# Patient Record
Sex: Female | Born: 1965 | Race: White | Hispanic: No | Marital: Single | Smoking: Never smoker
Health system: Southern US, Community
[De-identification: ages and names within clinical notes are randomized; demographics above are authoritative.]

## PROBLEM LIST (undated history)

## (undated) DIAGNOSIS — Z9889 Other specified postprocedural states: Secondary | ICD-10-CM

## (undated) DIAGNOSIS — F419 Anxiety disorder, unspecified: Secondary | ICD-10-CM

## (undated) DIAGNOSIS — I10 Essential (primary) hypertension: Secondary | ICD-10-CM

## (undated) DIAGNOSIS — R112 Nausea with vomiting, unspecified: Secondary | ICD-10-CM

## (undated) HISTORY — PX: BREAST EXCISIONAL BIOPSY: SUR124

---

## 1998-07-20 HISTORY — PX: OTHER SURGICAL HISTORY: SHX169

## 2001-12-15 ENCOUNTER — Ambulatory Visit (HOSPITAL_BASED_OUTPATIENT_CLINIC_OR_DEPARTMENT_OTHER): Admission: RE | Admit: 2001-12-15 | Discharge: 2001-12-15 | Payer: Self-pay | Admitting: Orthopedic Surgery

## 2009-12-02 ENCOUNTER — Encounter: Admission: RE | Admit: 2009-12-02 | Discharge: 2009-12-02 | Payer: Self-pay | Admitting: Family Medicine

## 2010-08-11 ENCOUNTER — Encounter: Payer: Self-pay | Admitting: Internal Medicine

## 2010-12-05 NOTE — Op Note (Signed)
[place]. [facility]  Patient:    Anita Moreno, Anita Moreno Visit Number: [id number] MRN: [id number]          Service Type: DSU Location: [facility] Attending Physician:  Colbert Ewing Dictated by:   Loreta Ave, M.D. Proc. 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  PROCEDURES: 1. Right knee examination under anesthesia. 2. Arthroscopy. 3. Chondroplasty of medial femoral condyle and patellofemoral joint.  SURGEON:  Loreta Ave, M.D.  ASSISTANT:  Arlys John D. Petrarca, P.A.-C.  ANESTHESIA:  General.  ESTIMATED BLOOD LOSS:  Minimal.  SPECIMENS:  None.  CULTURES:  None.  COMPLICATIONS:  None.  DRAINS:  Soft compressive.  DESCRIPTION OF PROCEDURE:  Patient brought to the operating room and placed on the operating table in supine position.  After adequate anesthesia had been obtained, right knee examined.  Some lateral tracking and increased Q-angle but did not tether.  Full motion and stable knee.  Tourniquet and leg holder applied, leg prepped and draped in the usual sterile fashion.  Three portals created, one superolateral, one each medial and lateral peripatellar.  Inflow catheter introduced, knee distended, arthroscope introduced, and the knee inspected.  A little lateral tracking but no lateral tethering, patellofemoral joint.  Focal grade 2 and 3 changes in the peak of the patella and on the trochlea, treated with chondroplasty to a stable surface.  After this, the patellofemoral joint looked reasonably good with remaining cartilage of reasonable thickness throughout.  The medial compartment revealed extensive grade 3 changes with chondral flaps, loose bodies, the entire weightbearing dome, medial femoral condyle.  Loose  bodies removed, chondroplasty to a stable surface.  Plateau looked good.  No medial meniscus tear.  Cruciate ligaments intact.  Lateral meniscus, lateral compartment normal.  The entire knee examined and no significant findings appreciated.  Instruments and fluid removed.  Portals and knee injected with Marcaine.  Portals closed with 4-0 nylon.  Sterile compressive dressing applied.  Anesthesia reversed.  Brought to the recovery room.  Tolerated the surgery well without complications. Dictated by:   Loreta Ave, M.D. Attending Physician:  Colbert Ewing DD:  12/15/01 TD:  12/16/01 Job: [id number]

## 2011-01-09 ENCOUNTER — Other Ambulatory Visit: Payer: Self-pay | Admitting: Family Medicine

## 2011-01-09 DIAGNOSIS — Z1231 Encounter for screening mammogram for malignant neoplasm of breast: Secondary | ICD-10-CM

## 2011-01-25 ENCOUNTER — Emergency Department (HOSPITAL_COMMUNITY)
Admission: EM | Admit: 2011-01-25 | Discharge: 2011-01-26 | Disposition: A | Discharge status: Home / Self Care | Payer: 59 | Attending: Emergency Medicine | Admitting: Emergency Medicine

## 2011-01-25 ENCOUNTER — Emergency Department (HOSPITAL_COMMUNITY): Payer: 59

## 2011-01-25 DIAGNOSIS — R079 Chest pain, unspecified: Secondary | ICD-10-CM | POA: Insufficient documentation

## 2011-01-25 HISTORY — DX: Essential (primary) hypertension: I10

## 2011-01-25 LAB — CBC
Hemoglobin: 12.9 g/dL (ref 12.0–15.0)
MCH: 28.4 pg (ref 26.0–34.0)
MCHC: 34.6 g/dL (ref 30.0–36.0)
RBC: 4.55 MIL/uL (ref 3.87–5.11)
RDW: 15.3 % (ref 11.5–15.5)
WBC: 7.3 10*3/uL (ref 4.0–10.5)

## 2011-01-25 LAB — BASIC METABOLIC PANEL
CO2: 26 mEq/L (ref 19–32)
Creatinine, Ser: 0.66 mg/dL (ref 0.50–1.10)
GFR calc non Af Amer: >60 mL/min (ref >60)
Glucose, Bld: 101 mg/dL — ABNORMAL HIGH (ref 70–99)

## 2011-01-25 LAB — TROPONIN I: Troponin I: <0.3 ng/mL (ref <0.30)

## 2011-01-25 LAB — DIFFERENTIAL
Eosinophils Absolute: 0.1 10*3/uL (ref 0.0–0.7)
Lymphocytes Relative: 32 % (ref 12–46)
Lymphs Abs: 2.3 10*3/uL (ref 0.7–4.0)
Monocytes Relative: 5 % (ref 3–12)
Neutro Abs: 4.4 10*3/uL (ref 1.7–7.7)

## 2011-01-25 LAB — CK TOTAL AND CKMB (NOT AT ARMC): Total CK: 60 U/L (ref 7–177)

## 2011-01-26 ENCOUNTER — Encounter (HOSPITAL_COMMUNITY): Payer: Self-pay | Admitting: Radiology

## 2011-01-26 ENCOUNTER — Emergency Department (HOSPITAL_COMMUNITY): Payer: 59

## 2011-01-26 MED ORDER — IOHEXOL 350 MG/ML SOLN: 80.0000 mL | Freq: Once | INTRAVENOUS | Status: DC | PRN

## 2011-01-30 ENCOUNTER — Ambulatory Visit
Admission: RE | Admit: 2011-01-30 | Discharge: 2011-01-30 | Disposition: A | Discharge status: Home / Self Care | Payer: 59 | Source: Ambulatory Visit | Attending: Family Medicine | Admitting: Family Medicine

## 2011-01-30 DIAGNOSIS — Z1231 Encounter for screening mammogram for malignant neoplasm of breast: Secondary | ICD-10-CM

## 2011-03-06 ENCOUNTER — Other Ambulatory Visit: Payer: Self-pay | Admitting: Family Medicine

## 2011-03-06 DIAGNOSIS — R928 Other abnormal and inconclusive findings on diagnostic imaging of breast: Secondary | ICD-10-CM

## 2011-03-18 ENCOUNTER — Other Ambulatory Visit: Payer: Self-pay | Admitting: Family Medicine

## 2011-03-18 ENCOUNTER — Ambulatory Visit
Admission: RE | Admit: 2011-03-18 | Discharge: 2011-03-18 | Disposition: A | Discharge status: Home / Self Care | Payer: 59 | Source: Ambulatory Visit | Attending: Family Medicine | Admitting: Family Medicine

## 2011-03-18 DIAGNOSIS — R928 Other abnormal and inconclusive findings on diagnostic imaging of breast: Secondary | ICD-10-CM

## 2011-03-20 ENCOUNTER — Ambulatory Visit
Admission: RE | Admit: 2011-03-20 | Discharge: 2011-03-20 | Disposition: A | Discharge status: Home / Self Care | Payer: 59 | Source: Ambulatory Visit | Attending: Family Medicine | Admitting: Family Medicine

## 2011-03-20 ENCOUNTER — Other Ambulatory Visit: Payer: Self-pay | Admitting: Diagnostic Radiology

## 2011-03-20 ENCOUNTER — Other Ambulatory Visit: Payer: Self-pay | Admitting: Family Medicine

## 2011-03-20 DIAGNOSIS — R928 Other abnormal and inconclusive findings on diagnostic imaging of breast: Secondary | ICD-10-CM

## 2011-03-24 ENCOUNTER — Ambulatory Visit
Admission: RE | Admit: 2011-03-24 | Discharge: 2011-03-24 | Disposition: A | Discharge status: Home / Self Care | Payer: 59 | Source: Ambulatory Visit | Attending: Family Medicine | Admitting: Family Medicine

## 2011-03-24 DIAGNOSIS — R928 Other abnormal and inconclusive findings on diagnostic imaging of breast: Secondary | ICD-10-CM

## 2011-04-06 ENCOUNTER — Other Ambulatory Visit (INDEPENDENT_AMBULATORY_CARE_PROVIDER_SITE_OTHER): Payer: Self-pay | Admitting: Surgery

## 2011-04-06 ENCOUNTER — Encounter (INDEPENDENT_AMBULATORY_CARE_PROVIDER_SITE_OTHER): Payer: Self-pay | Admitting: Surgery

## 2011-04-06 ENCOUNTER — Ambulatory Visit (INDEPENDENT_AMBULATORY_CARE_PROVIDER_SITE_OTHER): Payer: 59 | Admitting: Surgery

## 2011-04-06 VITALS — BP 124/82 | HR 64 | Temp 96.8°F | Ht 63.0 in | Wt 180.0 lb

## 2011-04-06 DIAGNOSIS — N63 Unspecified lump in unspecified breast: Secondary | ICD-10-CM

## 2011-04-06 DIAGNOSIS — N6320 Unspecified lump in the left breast, unspecified quadrant: Secondary | ICD-10-CM | POA: Insufficient documentation

## 2011-04-06 NOTE — Progress Notes (Signed)
Chief Complaint  Patient presents with  . 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 Dr. Micheline Maze [phone] MGM, [place], BX    HPI Anita Moreno is a 45 y.o. female.   HPI She is referred by Dr. Micheline Maze for evaluation of a mass in the left breast. This was detected on screening mammography. She does regular self examinations and has not felt any mass in the past. She has had no previous history of breast biopsies. She denies drainage from her nipples. She has 2 maternal aunts have had breast cancer. Past Medical History  Diagnosis Date  . Hypertension     Past Surgical History  Procedure Date  . Arthroscopic knee surgery 2000    right    Family History  Problem Relation Age of Onset  . Heart disease Father   . Cancer Sister     ovarian  . Cancer Maternal Aunt     breast  . Cancer Maternal Aunt     ovarian    Social History History  Substance Use Topics  . Smoking status: Never Smoker   . Smokeless tobacco: Never Used  . Alcohol Use: Yes     two glasses per week    No Known Allergies  No current outpatient prescriptions on file.    Review of Systems Review of Systems  Constitutional: Negative.   HENT: Negative.   Eyes: Negative.   Respiratory: Negative.   Cardiovascular: Negative.   Gastrointestinal: Negative.   Genitourinary: Negative.   Musculoskeletal: Negative.   Neurological: Negative.   Hematological: Negative.   Psychiatric/Behavioral: Negative.     Blood pressure 124/82, pulse 64, temperature 96.8 F (36 C), temperature source Temporal, height 5\' 3"  (1.6 m), weight 180 lb (81.647 kg).  Physical Exam Physical Exam  Constitutional: She is oriented to person, place, and time. She appears well-developed and well-nourished. No distress.  HENT:  Head: Normocephalic and atraumatic.  Right Ear: External ear normal.  Left Ear: External ear normal.  Nose: Nose normal.  Mouth/Throat: Oropharynx is clear and moist. No oropharyngeal exudate.    Eyes: Conjunctivae are normal. Pupils are equal, round, and reactive to light. No scleral icterus.  Neck: Neck supple. No tracheal deviation present. No thyromegaly present.  Cardiovascular: Normal rate, regular rhythm, normal heart sounds and intact distal pulses.   No murmur heard. Pulmonary/Chest: Effort normal and breath sounds normal. No respiratory distress. She has no wheezes. She has no rales.  Abdominal: Soft. Bowel sounds are normal. She exhibits no distension and no mass. There is no tenderness. There is no rebound and no guarding.  Musculoskeletal: Normal range of motion. She exhibits no edema and no tenderness.  Lymphadenopathy:    She has no axillary adenopathy.  Neurological: She is oriented to person, place, and time. No cranial nerve deficit. Coordination normal.  Skin: Skin is warm and dry. No rash noted. No erythema.  Psychiatric: Her behavior is normal. Judgment normal.  On breast examination, they are normal in appearance bilaterally including the nipple and areola. I can palpate no masses in either breast.  Data Reviewed  I have her mammograms and ultrasounds as well as biopsy results. She has a 1.3 cm mass at the 5:00 position of the left breast as well as a much smaller lesion at the 2:00 position. The biopsy at the 2:00 position with fibrocystic changes. The biopsy at the 5:00 position showed fibroadenoma versus phyloides tumorAssessment    Impression: This is a patient with a left  breast mass of uncertain etiology.    Plan      Needle localized removal of this mass is recommended given the biopsy results to rule out a sarcoma. I discussed the surgery with her in detail. I discussed the risk which include but not limited to bleeding, infection, need for further surgery, etc. she understands and wished to proceed.    Seven Dollens A 04/06/2011, 11:17 AM

## 2011-04-09 ENCOUNTER — Ambulatory Visit
Admission: RE | Admit: 2011-04-09 | Discharge: 2011-04-09 | Disposition: A | Discharge status: Home / Self Care | Payer: 59 | Source: Ambulatory Visit | Attending: Surgery | Admitting: Surgery

## 2011-04-09 ENCOUNTER — Other Ambulatory Visit (INDEPENDENT_AMBULATORY_CARE_PROVIDER_SITE_OTHER): Payer: Self-pay | Admitting: Surgery

## 2011-04-09 ENCOUNTER — Ambulatory Visit (HOSPITAL_BASED_OUTPATIENT_CLINIC_OR_DEPARTMENT_OTHER)
Admission: RE | Admit: 2011-04-09 | Discharge: 2011-04-09 | Disposition: A | Discharge status: Home / Self Care | Payer: 59 | Source: Ambulatory Visit | Attending: Surgery | Admitting: Surgery

## 2011-04-09 DIAGNOSIS — N63 Unspecified lump in unspecified breast: Secondary | ICD-10-CM

## 2011-04-09 DIAGNOSIS — D249 Benign neoplasm of unspecified breast: Secondary | ICD-10-CM

## 2011-04-09 DIAGNOSIS — Z01812 Encounter for preprocedural laboratory examination: Secondary | ICD-10-CM | POA: Insufficient documentation

## 2011-04-09 LAB — POCT HEMOGLOBIN-HEMACUE: Hemoglobin: 15.5 g/dL — ABNORMAL HIGH (ref 12.0–15.0)

## 2011-04-10 ENCOUNTER — Ambulatory Visit (HOSPITAL_COMMUNITY): Admission: RE | Admit: 2011-04-10 | Payer: 59 | Source: Ambulatory Visit | Admitting: Surgery

## 2011-04-14 ENCOUNTER — Encounter (INDEPENDENT_AMBULATORY_CARE_PROVIDER_SITE_OTHER): Payer: Self-pay

## 2011-04-15 ENCOUNTER — Telehealth (INDEPENDENT_AMBULATORY_CARE_PROVIDER_SITE_OTHER): Payer: Self-pay

## 2011-04-15 NOTE — Telephone Encounter (Signed)
Pt called and requested her rtw note be faxed to [phone] attention Elisa at the [facility].  I faxed it.

## 2011-04-20 NOTE — Op Note (Signed)
  NAMECARMILLA, Anita Moreno                ACCOUNT NO.:  [id number]  MEDICAL RECORD NO.:  [id number]  LOCATION:  SDS                          FACILITY:  [facility]  PHYSICIAN:  Abigail Miyamoto, M.D. DATE OF BIRTH:  1965/11/13  DATE OF PROCEDURE:  04/09/2011 DATE OF DISCHARGE:                              OPERATIVE REPORT   PREOPERATIVE DIAGNOSIS:  Left breast mass.  POSTOPERATIVE DIAGNOSIS:  Left breast mass.  PROCEDURE:  Needle-localized excision of left breast mass.  SURGEON:  Abigail Miyamoto, MD.  ANESTHESIA:  General and 0.25% Marcaine.  ESTIMATED BLOOD LOSS:  Minimal.  INDICATIONS:  Anita Moreno is a  45 year old female who was found on screening myography to have a mass in her left breast.  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  Decision was made to proceed with needle-localized excision of the mass that was nonpalpable.  FINDINGS:  The patient was found to have the mass as indicated on needle localization.  It was confirmed to be in the specimen by Radiology.  PROCEDURE IN DETAIL:  The patient was brought to the operating room, identified as Anita Moreno.  She was placed supine on the operating room table and anesthesia was induced.  Her left breast were prepped and draped in the usual sterile fashion.  Localization wire was placed in approximately the 5 o'clock position and the radiologist placed an X on the skin corresponding to where the lesion concern was located I anesthetized the skin over top of this area with Marcaine.  I then made a small longitudinal incision across the breast with a #15 blade.  I took this down to the breast tissue with electrocautery.  I followed needle localization wire and was able to easily identify the mass.  I then removed the mass in its entirety with the electrocautery going down to the chest wall.  Once the specimen was removed, x-ray confirmed that the suspicious lesion was located in  the mass of breast tissue. Hemostasis was achieved with cautery.  I then irrigated the wound with saline.  I closed the subcutaneous tissue with interrupted 3-0 Vicryl sutures, closed the skin with running 4-0 Monocryl.  Steri-Strips, gauze, and tape were then applied.  The patient tolerated the procedure well.  All counts were correct at the end of procedure.  Specimen was sent to pathology for evaluation.  The patient was then extubated in the operating room, taken in stable condition to recovery room.     Abigail Miyamoto, M.D.     DB/MEDQ  D:  04/09/2011  T:  04/09/2011  Job:  [id number]  Electronically Signed by Abigail Miyamoto M.D. on 04/20/2011 09:10:36 AM

## 2011-04-22 HISTORY — PX: BREAST SURGERY: SHX581

## 2011-05-01 ENCOUNTER — Encounter (INDEPENDENT_AMBULATORY_CARE_PROVIDER_SITE_OTHER): Payer: 59 | Admitting: Surgery

## 2011-05-04 ENCOUNTER — Encounter (INDEPENDENT_AMBULATORY_CARE_PROVIDER_SITE_OTHER): Payer: 59 | Admitting: Surgery

## 2011-06-19 ENCOUNTER — Encounter (HOSPITAL_COMMUNITY): Payer: Self-pay

## 2011-06-19 ENCOUNTER — Encounter (HOSPITAL_COMMUNITY): Payer: Self-pay | Admitting: *Deleted

## 2011-06-22 ENCOUNTER — Encounter (HOSPITAL_COMMUNITY): Payer: Self-pay | Admitting: *Deleted

## 2011-06-22 ENCOUNTER — Ambulatory Visit (HOSPITAL_COMMUNITY): Payer: BC Managed Care – PPO

## 2011-06-22 ENCOUNTER — Ambulatory Visit (HOSPITAL_COMMUNITY)
Admission: RE | Admit: 2011-06-22 | Discharge: 2011-06-23 | Disposition: A | Discharge status: Home / Self Care | Payer: BC Managed Care – PPO | Source: Ambulatory Visit | Attending: Orthopedic Surgery | Admitting: Orthopedic Surgery

## 2011-06-22 ENCOUNTER — Encounter (HOSPITAL_COMMUNITY)
Admission: RE | Disposition: A | Discharge status: Home / Self Care | Payer: Self-pay | Source: Ambulatory Visit | Attending: Orthopedic Surgery

## 2011-06-22 ENCOUNTER — Other Ambulatory Visit: Payer: Self-pay | Admitting: Orthopedic Surgery

## 2011-06-22 ENCOUNTER — Ambulatory Visit (HOSPITAL_COMMUNITY): Payer: BC Managed Care – PPO | Admitting: *Deleted

## 2011-06-22 DIAGNOSIS — M5126 Other intervertebral disc displacement, lumbar region: Secondary | ICD-10-CM

## 2011-06-22 HISTORY — DX: Anxiety disorder, unspecified: F41.9

## 2011-06-22 HISTORY — DX: Nausea with vomiting, unspecified: Z98.890

## 2011-06-22 HISTORY — DX: Nausea with vomiting, unspecified: R11.2

## 2011-06-22 HISTORY — PX: LUMBAR LAMINECTOMY: SHX95

## 2011-06-22 LAB — SURGICAL PCR SCREEN
MRSA, PCR: NEGATIVE
Staphylococcus aureus: NEGATIVE

## 2011-06-22 SURGERY — MICRODISCECTOMY LUMBAR LAMINECTOMY
Anesthesia: General | Site: Back | Laterality: Left | Wound class: Clean

## 2011-06-22 MED ORDER — CITALOPRAM HYDROBROMIDE 20 MG PO TABS
20.0000 mg | ORAL_TABLET | Freq: Every day | ORAL | Status: DC
  Filled 2011-06-22: qty 1

## 2011-06-22 MED ORDER — MIDAZOLAM HCL 5 MG/5ML IJ SOLN
INTRAMUSCULAR | Status: DC | PRN
  Administered 2011-06-22: 2 mg via INTRAVENOUS

## 2011-06-22 MED ORDER — NEOSTIGMINE METHYLSULFATE 1 MG/ML IJ SOLN
INTRAMUSCULAR | Status: DC | PRN
  Administered 2011-06-22: 3 mg via INTRAVENOUS

## 2011-06-22 MED ORDER — METOCLOPRAMIDE HCL 5 MG/ML IJ SOLN: 5.0000 mg | Freq: Three times a day (TID) | INTRAMUSCULAR | Status: DC | PRN

## 2011-06-22 MED ORDER — CEFAZOLIN SODIUM 1-5 GM-% IV SOLN
1.0000 g | Freq: Four times a day (QID) | INTRAVENOUS | Status: AC
  Administered 2011-06-23 (×3): 1 g via INTRAVENOUS
  Filled 2011-06-22 (×3): qty 50

## 2011-06-22 MED ORDER — MEPERIDINE HCL 25 MG/ML IJ SOLN: 6.2500 mg | INTRAMUSCULAR | Status: DC | PRN

## 2011-06-22 MED ORDER — ACETAMINOPHEN 10 MG/ML IV SOLN
INTRAVENOUS | Status: DC | PRN
  Administered 2011-06-22: 1000 mg via INTRAVENOUS

## 2011-06-22 MED ORDER — CEFAZOLIN SODIUM 1-5 GM-% IV SOLN
1.0000 g | INTRAVENOUS | Status: AC
  Administered 2011-06-22: 1 g via INTRAVENOUS

## 2011-06-22 MED ORDER — HYDROMORPHONE HCL PF 1 MG/ML IJ SOLN
INTRAMUSCULAR | Status: AC
  Administered 2011-06-22: 0.5 mg via INTRAVENOUS
  Filled 2011-06-22: qty 1

## 2011-06-22 MED ORDER — SUCCINYLCHOLINE CHLORIDE 20 MG/ML IJ SOLN
INTRAMUSCULAR | Status: DC | PRN
  Administered 2011-06-22: 100 mg via INTRAVENOUS

## 2011-06-22 MED ORDER — ONDANSETRON HCL 4 MG/2ML IJ SOLN
4.0000 mg | Freq: Four times a day (QID) | INTRAMUSCULAR | Status: DC | PRN
  Administered 2011-06-23: 4 mg via INTRAVENOUS
  Filled 2011-06-22: qty 2

## 2011-06-22 MED ORDER — HYDROMORPHONE HCL PF 1 MG/ML IJ SOLN
INTRAMUSCULAR | Status: AC
  Administered 2011-06-22: 1 mg via INTRAVENOUS
  Filled 2011-06-22: qty 1

## 2011-06-22 MED ORDER — ONDANSETRON HCL 4 MG PO TABS: 4.0000 mg | ORAL_TABLET | Freq: Four times a day (QID) | ORAL | Status: DC | PRN

## 2011-06-22 MED ORDER — OXYCODONE-ACETAMINOPHEN 5-325 MG PO TABS
1.0000 | ORAL_TABLET | ORAL | Status: DC | PRN
  Administered 2011-06-23: 1 via ORAL
  Filled 2011-06-22: qty 1

## 2011-06-22 MED ORDER — PROPOFOL 10 MG/ML IV BOLUS
INTRAVENOUS | Status: DC | PRN
  Administered 2011-06-22: 160 mg via INTRAVENOUS

## 2011-06-22 MED ORDER — ROCURONIUM BROMIDE 100 MG/10ML IV SOLN
INTRAVENOUS | Status: DC | PRN
  Administered 2011-06-22: 40 mg via INTRAVENOUS

## 2011-06-22 MED ORDER — GLYCOPYRROLATE 0.2 MG/ML IJ SOLN
INTRAMUSCULAR | Status: DC | PRN
  Administered 2011-06-22: .4 mg via INTRAVENOUS

## 2011-06-22 MED ORDER — ONDANSETRON HCL 4 MG/2ML IJ SOLN
INTRAMUSCULAR | Status: DC | PRN
  Administered 2011-06-22: 4 mg via INTRAVENOUS

## 2011-06-22 MED ORDER — POVIDONE-IODINE 7.5 % EX SOLN
Freq: Once | CUTANEOUS | Status: DC
  Filled 2011-06-22: qty 118

## 2011-06-22 MED ORDER — DEXAMETHASONE SODIUM PHOSPHATE 4 MG/ML IJ SOLN
8.0000 mg | Freq: Once | INTRAMUSCULAR | Status: AC | PRN
  Administered 2011-06-22: 8 mg via INTRAVENOUS
  Filled 2011-06-22: qty 2

## 2011-06-22 MED ORDER — THROMBIN 5000 UNITS EX SOLR
CUTANEOUS | Status: AC
  Filled 2011-06-22: qty 10000

## 2011-06-22 MED ORDER — HYDROMORPHONE HCL PF 2 MG/ML IJ SOLN
0.5000 mg | INTRAMUSCULAR | Status: DC | PRN
  Administered 2011-06-23 (×2): 1 mg via INTRAVENOUS
  Filled 2011-06-22 (×2): qty 1

## 2011-06-22 MED ORDER — THROMBIN 5000 UNITS EX KIT
PACK | CUTANEOUS | Status: DC | PRN
  Administered 2011-06-22: 10000 [IU] via TOPICAL

## 2011-06-22 MED ORDER — DEXAMETHASONE SODIUM PHOSPHATE 10 MG/ML IJ SOLN
INTRAMUSCULAR | Status: AC
  Filled 2011-06-22: qty 1

## 2011-06-22 MED ORDER — FENTANYL CITRATE 0.05 MG/ML IJ SOLN
INTRAMUSCULAR | Status: DC | PRN
  Administered 2011-06-22 (×2): 100 ug via INTRAVENOUS

## 2011-06-22 MED ORDER — CEFAZOLIN SODIUM 1-5 GM-% IV SOLN
INTRAVENOUS | Status: AC
  Filled 2011-06-22: qty 50

## 2011-06-22 MED ORDER — HYDROMORPHONE HCL PF 1 MG/ML IJ SOLN
0.2500 mg | INTRAMUSCULAR | Status: DC | PRN
  Administered 2011-06-22 (×4): 0.5 mg via INTRAVENOUS

## 2011-06-22 MED ORDER — LACTATED RINGERS IV SOLN: INTRAVENOUS | Status: DC

## 2011-06-22 MED ORDER — DEXTROSE-NACL 5-0.45 % IV SOLN
INTRAVENOUS | Status: DC
  Administered 2011-06-23: 01:00:00 via INTRAVENOUS

## 2011-06-22 MED ORDER — DEXAMETHASONE SODIUM PHOSPHATE 10 MG/ML IJ SOLN
INTRAMUSCULAR | Status: DC | PRN
  Administered 2011-06-22: 10 mg via INTRAVENOUS

## 2011-06-22 MED ORDER — HYDROMORPHONE HCL PF 2 MG/ML IJ SOLN
0.5000 mg | INTRAMUSCULAR | Status: DC | PRN
  Administered 2011-06-22: 1 mg via INTRAVENOUS

## 2011-06-22 MED ORDER — LACTATED RINGERS IV SOLN
INTRAVENOUS | Status: DC | PRN
  Administered 2011-06-22 (×2): via INTRAVENOUS

## 2011-06-22 MED ORDER — ACETAMINOPHEN 10 MG/ML IV SOLN
INTRAVENOUS | Status: AC
  Filled 2011-06-22: qty 100

## 2011-06-22 MED ORDER — METOCLOPRAMIDE HCL 10 MG PO TABS: 5.0000 mg | ORAL_TABLET | Freq: Three times a day (TID) | ORAL | Status: DC | PRN

## 2011-06-22 MED ORDER — LIDOCAINE HCL (PF) 2 % IJ SOLN
INTRAMUSCULAR | Status: DC | PRN
  Administered 2011-06-22: 50 mg

## 2011-06-22 SURGICAL SUPPLY — 42 items
APL SKNCLS STERI-STRIP NONHPOA (GAUZE/BANDAGES/DRESSINGS) ×1
BAG ZIPLOCK 12X15 (MISCELLANEOUS) ×2 IMPLANT
BENZOIN TINCTURE PRP APPL 2/3 (GAUZE/BANDAGES/DRESSINGS) ×2 IMPLANT
BUR EGG DIAMOND 4.0 (BURR) ×2 IMPLANT
CLEANER TIP ELECTROSURG 2X2 (MISCELLANEOUS) ×2 IMPLANT
CLOTH BEACON ORANGE TIMEOUT ST (SAFETY) ×2 IMPLANT
CONT SPEC 4OZ CLIKSEAL STRL BL (MISCELLANEOUS) IMPLANT
DRAIN PENROSE 18X1/4 LTX STRL (WOUND CARE) IMPLANT
DRAPE MICROSCOPE LEICA (MISCELLANEOUS) ×2 IMPLANT
DRAPE POUCH INSTRU U-SHP 10X18 (DRAPES) ×2 IMPLANT
DRAPE SURG 17X11 SM STRL (DRAPES) ×2 IMPLANT
DRSG ADAPTIC 3X8 NADH LF (GAUZE/BANDAGES/DRESSINGS) ×2 IMPLANT
DRSG PAD ABDOMINAL 8X10 ST (GAUZE/BANDAGES/DRESSINGS) ×2 IMPLANT
DURAPREP 26ML APPLICATOR (WOUND CARE) ×2 IMPLANT
ELECT BLADE TIP CTD 4 INCH (ELECTRODE) ×2 IMPLANT
ELECT REM PT RETURN 9FT ADLT (ELECTROSURGICAL) ×2
ELECTRODE REM PT RTRN 9FT ADLT (ELECTROSURGICAL) ×1 IMPLANT
GAUZE SPONGE 4X4 12PLY STRL LF (GAUZE/BANDAGES/DRESSINGS) ×2 IMPLANT
GLOVE BIO SURGEON STRL SZ8 (GLOVE) ×4 IMPLANT
GLOVE BIOGEL PI IND STRL 8 (GLOVE) ×1 IMPLANT
GLOVE BIOGEL PI INDICATOR 8 (GLOVE) ×1
GLOVE ECLIPSE 8.0 STRL XLNG CF (GLOVE) ×2 IMPLANT
GOWN STRL REIN XL XLG (GOWN DISPOSABLE) ×4 IMPLANT
KIT BASIN OR (CUSTOM PROCEDURE TRAY) ×2 IMPLANT
KIT POSITIONING SURG ANDREWS (MISCELLANEOUS) ×2 IMPLANT
MANIFOLD NEPTUNE II (INSTRUMENTS) ×2 IMPLANT
NEEDLE SPNL 18GX3.5 QUINCKE PK (NEEDLE) ×6 IMPLANT
NS IRRIG 1000ML POUR BTL (IV SOLUTION) ×2 IMPLANT
PATTIES SURGICAL .5 X.5 (GAUZE/BANDAGES/DRESSINGS) ×2 IMPLANT
PATTIES SURGICAL .75X.75 (GAUZE/BANDAGES/DRESSINGS) ×2 IMPLANT
PATTIES SURGICAL 1X1 (DISPOSABLE) ×2 IMPLANT
POSITIONER SURGICAL ARM (MISCELLANEOUS) ×2 IMPLANT
SPONGE LAP 4X18 X RAY DECT (DISPOSABLE) ×2 IMPLANT
SPONGE SURGIFOAM ABS GEL 100 (HEMOSTASIS) ×2 IMPLANT
STAPLER VISISTAT 35W (STAPLE) IMPLANT
SUT VIC AB 1 CT1 27 (SUTURE) ×2
SUT VIC AB 1 CT1 27XBRD ANTBC (SUTURE) ×2 IMPLANT
SUT VIC AB 2-0 CT1 27 (SUTURE) ×4
SUT VIC AB 2-0 CT1 27XBRD (SUTURE) ×2 IMPLANT
TAPE CLOTH SURG 4X10 WHT LF (GAUZE/BANDAGES/DRESSINGS) ×2 IMPLANT
TRAY LAMINECTOMY (CUSTOM PROCEDURE TRAY) ×2 IMPLANT
WATER STERILE IRR 1500ML POUR (IV SOLUTION) IMPLANT

## 2011-06-22 NOTE — Anesthesia Procedure Notes (Addendum)
Procedure Name: Intubation Date/Time: 06/22/2011 6:57 PM Performed by: Joycie Peek Pre-anesthesia Checklist: Patient identified, Emergency Drugs available, Suction available, Patient being monitored and Timeout performed Patient Re-evaluated:Patient Re-evaluated prior to inductionOxygen Delivery Method: Circle System Utilized Preoxygenation: Pre-oxygenation with 100% oxygen Intubation Type: IV induction Ventilation: Mask ventilation without difficulty Laryngoscope Size: Mac and 3 Grade View: Grade I Tube size: 7.5 mm Number of attempts: 1 Airway Equipment and Method: stylet

## 2011-06-22 NOTE — Anesthesia Postprocedure Evaluation (Signed)
  Anesthesia Post-op Note  Patient: Anita Moreno  Procedure(s) Performed:  MICRODISCECTOMY LUMBAR LAMINECTOMY - Microdiscectomy Discectomy Lumbar 4 -Lumbar 5   (xray)   Patient Location: PACU  Anesthesia Type: General  Level of Consciousness: awake and alert   Airway and Oxygen Therapy: Patient Spontanous Breathing  Post-op Pain: mild  Post-op Assessment: Post-op Vital signs reviewed, Patient's Cardiovascular Status Stable, Respiratory Function Stable, Patent Airway and No signs of Nausea or vomiting  Post-op Vital Signs: stable  Complications: No apparent anesthesia complications

## 2011-06-22 NOTE — Anesthesia Preprocedure Evaluation (Signed)
Anesthesia Evaluation  Patient identified by MRN, date of birth, ID band Patient awake    Reviewed: Allergy & Precautions, H&P , NPO status , Patient's Chart, lab work & pertinent test results  History of Anesthesia Complications (+) PONV  Airway Mallampati: II TM Distance: >3 FB Neck ROM: Full    Dental No notable dental hx.    Pulmonary neg pulmonary ROS,  clear to auscultation  Pulmonary exam normal       Cardiovascular neg cardio ROS Regular Normal    Neuro/Psych Negative Neurological ROS  Negative Psych ROS   GI/Hepatic negative GI ROS, Neg liver ROS,   Endo/Other  Negative Endocrine ROS  Renal/GU negative Renal ROS  Genitourinary negative   Musculoskeletal negative musculoskeletal ROS (+)   Abdominal   Peds negative pediatric ROS (+)  Hematology negative hematology ROS (+)   Anesthesia Other Findings   Reproductive/Obstetrics negative OB ROS                           Anesthesia Physical Anesthesia Plan  ASA: II  Anesthesia Plan: General   Post-op Pain Management:    Induction: Intravenous  Airway Management Planned: Oral ETT  Additional Equipment:   Intra-op Plan:   Post-operative Plan: Extubation in OR  Informed Consent: I have reviewed the patients History and Physical, chart, labs and discussed the procedure including the risks, benefits and alternatives for the proposed anesthesia with the patient or authorized representative who has indicated his/her understanding and acceptance.   Dental advisory given  Plan Discussed with: CRNA  Anesthesia Plan Comments:         Anesthesia Quick Evaluation

## 2011-06-22 NOTE — Transfer of Care (Signed)
Immediate Anesthesia Transfer of Care Note  Patient: Anita Moreno  Procedure(s) Performed:  MICRODISCECTOMY LUMBAR LAMINECTOMY - Microdiscectomy Discectomy Lumbar 4 -Lumbar 5   (xray)   Patient Location: PACU  Anesthesia Type: General  Level of Consciousness: sedated  Airway & Oxygen Therapy: Patient Spontanous Breathing and Patient connected to face mask oxygen  Post-op Assessment: Report given to PACU RN and Post -op Vital signs reviewed and stable  Post vital signs: Reviewed and stable  Complications: No apparent anesthesia complications

## 2011-06-22 NOTE — Brief Op Note (Signed)
06/22/2011 Dr Worthy Rancher  9:11 PM  PATIENT:  Anita Moreno  45 y.o. 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 PROCEDURE:  Procedure(s): MICRODISCECTOMY LUMBAR LAMINECTOMY L4-5 lt  SURGEON:  Surgeon(s): James P Aplington Ronald A Gioffre  PHYSICIAN ASSISTANT:   ASSISTANTS:    ANESTHESIA:   general  EBL:  Total I/O In: 1600 [I.V.:1600] Out: 50 [Blood:50]  BLOOD ADMINISTERED:none  DRAINS: none   LOCAL MEDICATIONS USED:  NONE  SPECIMEN:  Source of Specimen:  disc material L4-5  DISPOSITION OF SPECIMEN:  PATHOLOGY  COUNTS:  correct  TOURNIQUET:  * No tourniquets in log *  DICTATION: .Other Dictation: Dictation Number [id number]  PLAN OF CARE: Admit for overnight observation  PATIENT DISPOSITION:  PACU - hemodynamically stable.

## 2011-06-22 NOTE — H&P (Cosign Needed)
Anita Moreno is an 45 y.o. female.   Chief Complaint:Lt leg pain,numbness,weakness HPI:MRI demonstrates HNP L4-5 central and to left  Past Medical History  Diagnosis Date  . PONV (postoperative nausea and vomiting) 2001 or 2002    for knee arthroscopy  . Anxiety     takes Celexa    Past Surgical History  Procedure Date  . Arthroscopic knee surgery 2000    right  . Breast surgery 04/22/2011    left cyst removed-benign    Family History  Problem Relation Age of Onset  . Heart disease Father   . Cancer Sister     ovarian  . Cancer Maternal Aunt     breast  . Cancer Maternal Aunt     ovarian   Social History:  reports that she has never smoked. She has never used smokeless tobacco. She reports that she drinks alcohol. She reports that she does not use illicit drugs.  Allergies: No Known Allergies  Medications Prior to Admission  Medication Dose Route Frequency Provider Last Rate Last Dose  . ceFAZolin (ANCEF) IVPB 1 g/50 mL premix  1 g Intravenous 60 min Pre-Op [name] P [name]      . dexamethasone (DECADRON) injection 8 mg  8 mg Intravenous Once PRN [name], MD      . HYDROmorphone (DILAUDID) injection 0.25-0.5 mg  0.25-0.5 mg Intravenous Q5 min PRN [name], MD      . lactated ringers infusion   Intravenous Continuous [name], MD      . meperidine (DEMEROL) injection 6.25-12.5 mg  6.25-12.5 mg Intravenous Q5 min PRN [name], MD      . povidone-iodine (BETADINE) 7.5 % scrub   Topical Once [name]       No current outpatient prescriptions on file as of 06/22/2011.    Results for orders placed during the hospital encounter of 06/22/11 (from the past 48 hour(s))  SURGICAL PCR SCREEN     Status: Normal   Collection Time   06/22/11  2:11 PM      Component Value Range Comment   MRSA, PCR NEGATIVE  NEGATIVE     Staphylococcus aureus NEGATIVE  NEGATIVE    HCG, SERUM, QUALITATIVE     Status: Normal   Collection Time   06/22/11  2:42 PM        Component Value Range Comment   Preg, Serum NEGATIVE  NEGATIVE     No results found.  ROS  Blood pressure 140/89, pulse 79, temperature 97.7 F (36.5 C), temperature source Oral, resp. rate 18, last menstrual period 06/12/2011, SpO2 99.00%. Physical Exam  Constitutional: She appears well-developed and well-nourished.  HENT:  Head: Normocephalic and atraumatic.  Right Ear: External ear normal.  Left Ear: External ear normal.  Nose: Nose normal.  Mouth/Throat: Oropharynx is clear and moist.  Eyes: Conjunctivae are normal.  Neck: Normal range of motion. Neck supple.  Cardiovascular: Normal rate, regular rhythm, normal heart sounds and intact distal pulses.   Respiratory: Effort normal and breath sounds normal.  GI: Soft. Bowel sounds are normal.  Musculoskeletal: Normal range of motion.  Neurological:       Numbness  Lt foot Weakness of dorsiflexion Lt foot  Skin: Skin is warm and dry.  Psychiatric: She has a normal mood and affect. Her behavior is normal. Judgment and thought content normal.     Assessment/Plan HNP L4-5 Lt Microdiscectomy L4-5 Lt  [name] P 06/22/2011, 6:39 PM

## 2011-06-23 MED ORDER — OXYCODONE-ACETAMINOPHEN 5-325 MG PO TABS: 1.0000 | ORAL_TABLET | ORAL | Status: AC | PRN

## 2011-06-23 MED ORDER — METHOCARBAMOL 500 MG PO TABS: 500.0000 mg | ORAL_TABLET | Freq: Four times a day (QID) | ORAL | Status: AC

## 2011-06-23 NOTE — Op Note (Signed)
NAMEMarland Kitchen  Anita Moreno, Anita Moreno NO.:  [id number]  MEDICAL RECORD NO.:  [id number]  LOCATION:  [facility]                         FACILITY:  [facility]  PHYSICIAN:  Marlowe Kays, M.D.  DATE OF BIRTH:  07/14/1966  DATE OF PROCEDURE: DATE OF DISCHARGE:                              OPERATIVE REPORT   PREOPERATIVE DIAGNOSIS:  Herniated nucleus pulposus, L4-L5 central to the left.  POSTOPERATIVE DIAGNOSIS:  Herniated nucleus pulposus, L4-L5 central to the left.  OPERATION:  Microdiskectomy, L4-L5 left.  SURGEON:  Marlowe Kays, M.D.  ASSISTANT:  Georges Lynch. Darrelyn Hillock, M.D.  ANESTHESIA:  General.  PATHOLOGY AND JUSTIFICATION FOR PROCEDURE:  She was having severe pain in her back and left leg with numbness in her entire foot and foot drop. The MRI demonstrated central and to the left disk herniation, and to my reading I suspect that she probably had some expelled free fragments of disk material.  This was confirmed at surgery.  PROCEDURE:  Prophylactic antibiotics, satisfied general anesthesia, knee- chest position on the Bynum frame.  Back was prepped with DuraPrep, draped in sterile field.  After time-out was performed, I placed 2 spinal needles and with lateral x-ray, we tentatively located the operative area at L4-L5.  A midline incision was made and the spinous process, which I thought was L4, I tagged with a Kocher clamp and lateral x-ray confirmed that this was indeed the spinous process of L4. We also were able to locate the position of the L4-L5 disk with a Penfield #4 instrument at the same time.  I then dissected soft tissue off the lamina of L4 and L5, mainly on the left side, but also with some slight dissection on the right side, to allow one of the blades of the self-retaining retractor to be placed.  After self-retaining retractors were placed, I then began removing bone and ligamentum flavum at L4-L5 on the left with a combination of double-action rongeur, 2  and 3 mm Kerrison rongeurs and small curettes.  During this process, we brought in a microscope for the finer points of the dissection.  On another x- ray, we tentatively identified the L4-5 disk and with retraction of meninges found a large fragment of disk material, which had been expelled from the disk.  We removed this by teasing it out of the interspace with a nerve hook and a combination of pituitary rongeur.  We continued to remove bone cephalad from L4 to make sure that we had gone as far up as was necessary to make sure that there were no remaining fragments of disk material.  A confirmatory x-ray with a needle in the interspace was taken.  When we felt that we had completed the decompression and the L5 foramen was free and the L5 nerve root was mobile, we irrigated the wound well, placed Gelfoam soaked in thrombin into the lateral lobe adjacent to the injuries.  There was no unusual bleeding at this point.  I removed the self-retaining retractors, and the wound was then closed in layers with interrupted #1 Vicryl and the fascia and paralumbar muscle leaving about 1 cm caudad for egress of any fluid collection.  The subcutaneous tissue was closed  with a combination #1 2-0 Vicryl, staples in the skin.  Betadine, Adaptic, dry sterile dressing were applied.  She tolerated the procedure well, was taken to the recovery room in a satisfactory condition with no known complications.  ESTIMATED BLOOD LOSS:  50 mL.  No blood replacement.          ______________________________ Marlowe Kays, M.D.     JA/MEDQ  D:  06/22/2011  T:  06/22/2011  Job:  [id number]

## 2011-06-23 NOTE — Progress Notes (Signed)
PT screen for  Pt ready for discharge. Pt up,ad lib in room. Reviewed  Back precautions and to ask for referral for orthotic in future if Lfootdrop persists to improve safety. No further PT needs. Has DME

## 2011-06-23 NOTE — Progress Notes (Signed)
06/23/2011 Raynelle Bring BSN CCM [phone] CM spoke with patient and caregiver. Plans are for d/c to home with caregiver. There are no HH needs. Pt already has DME including walker as needed. Pt discharged to home

## 2011-06-23 NOTE — Progress Notes (Signed)
D/c paperwork explained to pt and signed by pt.  Prescription for robaxin and hydrocodone given to pt.  Pt stable and ready for d/c. Pt d/c home at this time.

## 2011-06-24 ENCOUNTER — Encounter (HOSPITAL_COMMUNITY): Payer: Self-pay | Admitting: Orthopedic Surgery

## 2012-01-06 ENCOUNTER — Other Ambulatory Visit: Payer: Self-pay | Admitting: Family Medicine

## 2012-01-06 DIAGNOSIS — Z1231 Encounter for screening mammogram for malignant neoplasm of breast: Secondary | ICD-10-CM

## 2012-02-02 ENCOUNTER — Ambulatory Visit: Payer: BC Managed Care – PPO

## 2012-02-04 ENCOUNTER — Ambulatory Visit
Admission: RE | Admit: 2012-02-04 | Discharge: 2012-02-04 | Disposition: A | Discharge status: Home / Self Care | Payer: BC Managed Care – PPO | Source: Ambulatory Visit | Attending: Family Medicine | Admitting: Family Medicine

## 2012-02-04 DIAGNOSIS — Z1231 Encounter for screening mammogram for malignant neoplasm of breast: Secondary | ICD-10-CM

## 2012-07-04 IMAGING — CR DG SPINE 1V PORT
1 series · 1 of 1 positions shown · non-contrast
Comparison: None.

CLINICAL DATA: Lumbar decompression L4-L5.

DG SPINE PORTABLE - 1 VIEW

[lateral]
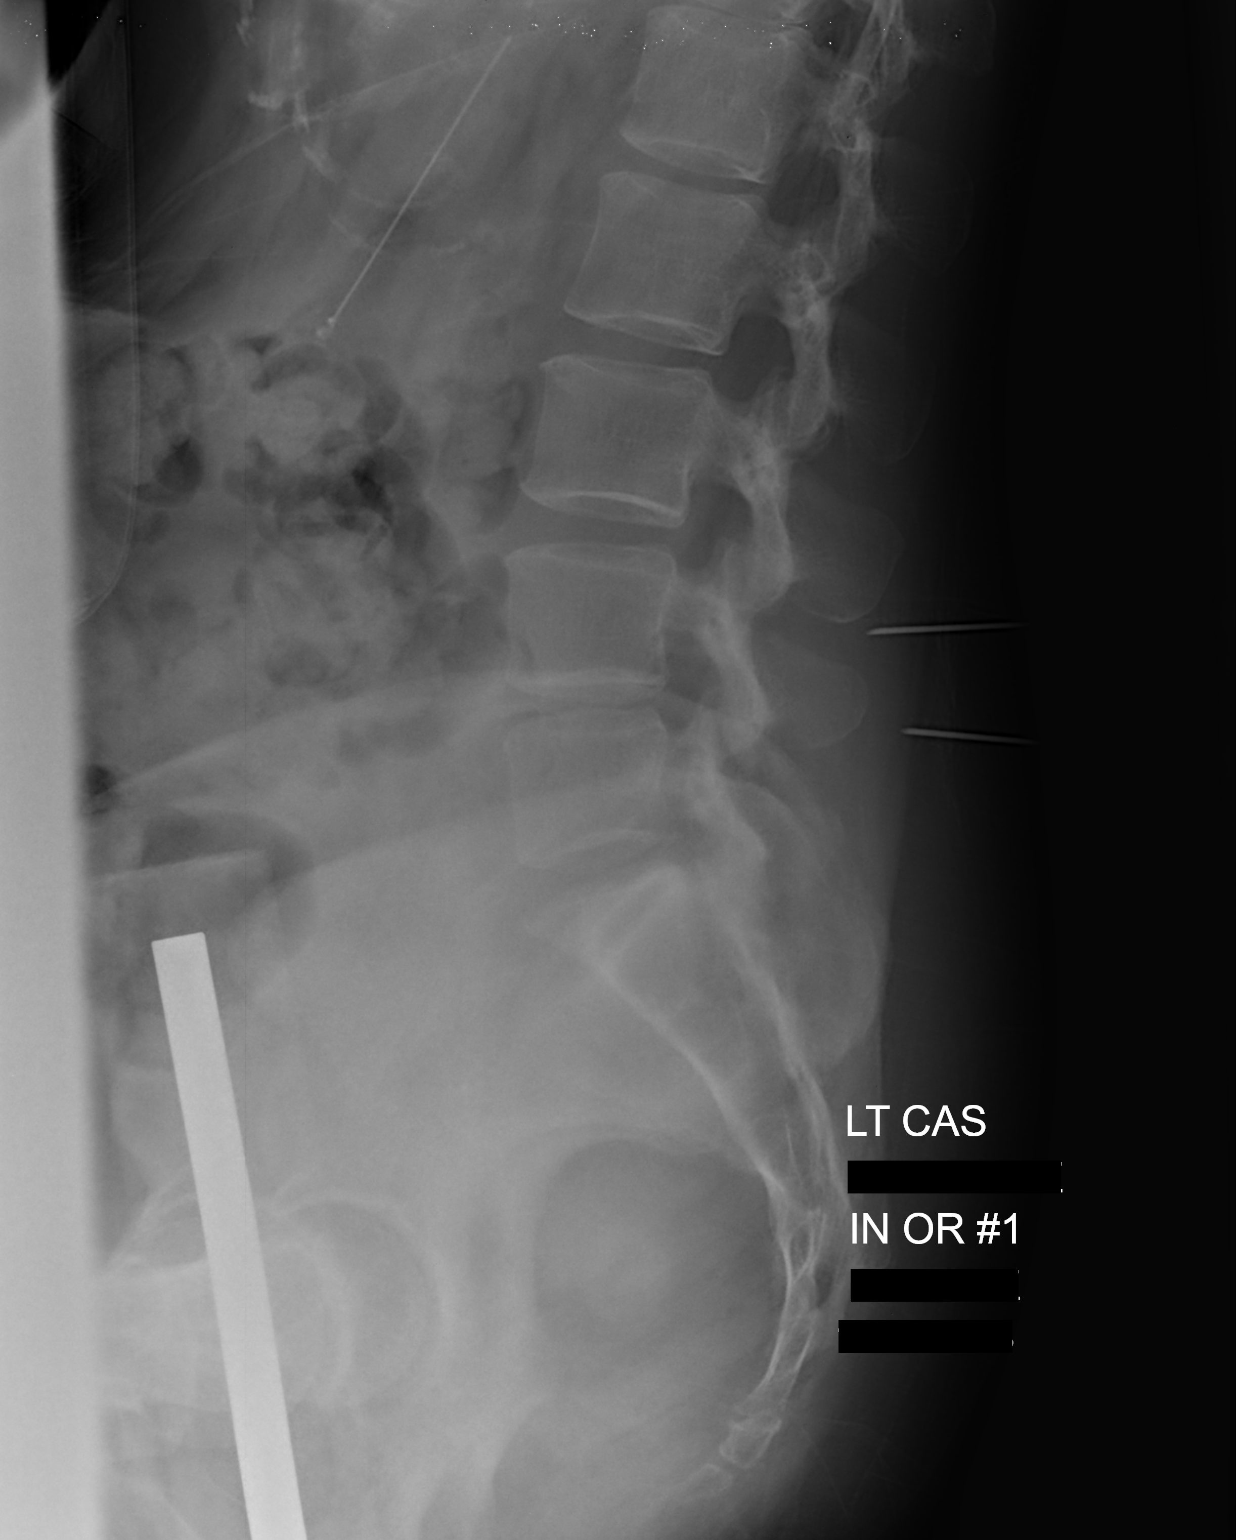

[1 of 1 positions shown; findings below may reference images not displayed]

FINDINGS: Five lumbar type vertebral bodies are assumed.  Cross-
table lateral view labeled #1 at 2187 hours demonstrates posterior
localizing needles inferior to the L3 spinous process and posterior
to the L4 spinous process.  The alignment is normal.
IMPRESSION: Intraoperative localization as described.

## 2013-03-16 ENCOUNTER — Ambulatory Visit: Payer: Self-pay | Admitting: General Practice

## 2013-11-21 ENCOUNTER — Ambulatory Visit (INDEPENDENT_AMBULATORY_CARE_PROVIDER_SITE_OTHER): Payer: BC Managed Care – PPO | Admitting: Nurse Practitioner

## 2013-11-21 ENCOUNTER — Encounter (INDEPENDENT_AMBULATORY_CARE_PROVIDER_SITE_OTHER): Payer: Self-pay

## 2013-11-21 ENCOUNTER — Encounter: Payer: Self-pay | Admitting: Nurse Practitioner

## 2013-11-21 VITALS — BP 149/83 | HR 72 | Temp 98.4°F | Ht 62.5 in | Wt 183.4 lb

## 2013-11-21 DIAGNOSIS — Z0000 Encounter for general adult medical examination without abnormal findings: Secondary | ICD-10-CM

## 2013-11-21 LAB — POCT CBC
GRANULOCYTE PERCENT: 69.7 % (ref 37–80)
HCT, POC: 37.8 % (ref 37.7–47.9)
Hemoglobin: 11.9 g/dL — AB (ref 12.2–16.2)
LYMPH, POC: 2.1 (ref 0.6–3.4)
MCH: 27.1 pg (ref 27–31.2)
MCHC: 31.6 g/dL — AB (ref 31.8–35.4)
MCV: 86 fL (ref 80–97)
MPV: 8.1 fL (ref 0–99.8)
PLATELET COUNT, POC: 247 10*3/uL (ref 142–424)
POC Granulocyte: 5.2 (ref 2–6.9)
POC LYMPH %: 28.1 % (ref 10–50)
RBC: 4.4 M/uL (ref 4.04–5.48)
RDW, POC: 13.2 %
WBC: 7.5 10*3/uL (ref 4.6–10.2)

## 2013-11-21 NOTE — Patient Instructions (Signed)

## 2013-11-21 NOTE — Progress Notes (Signed)
   Subjective:    Patient ID: Anita Moreno, female    DOB: 29-Jan-1966, 48 y.o.   MRN: [id number]  HPI Patient here today for annual physical exam no PAP- SHe is doing well- Has had some back problems that is being treated by Dr. Nelva Bush- Has injection scheduled for tomorrow. No other complaints today.    Review of Systems  Constitutional: Negative.   HENT: Negative.   Respiratory: Negative.   Cardiovascular: Negative.   Genitourinary: Negative.   Neurological: Negative.   Psychiatric/Behavioral: Negative.   All other systems reviewed and are negative.      Objective:   Physical Exam  Constitutional: She is oriented to person, place, and time. She appears well-developed and well-nourished.  HENT:  Nose: Nose normal.  Mouth/Throat: Oropharynx is clear and moist.  Eyes: EOM are normal.  Neck: Trachea normal, normal range of motion and full passive range of motion without pain. Neck supple. No JVD present. Carotid bruit is not present. No thyromegaly present.  Cardiovascular: Normal rate, regular rhythm, normal heart sounds and intact distal pulses.  Exam reveals no gallop and no friction rub.   No murmur heard. Pulmonary/Chest: Effort normal and breath sounds normal.  Abdominal: Soft. Bowel sounds are normal. She exhibits no distension and no mass. There is no tenderness.  Musculoskeletal: Normal range of motion.  Lymphadenopathy:    She has no cervical adenopathy.  Neurological: She is alert and oriented to person, place, and time. She has normal reflexes.  Skin: Skin is warm and dry.  Psychiatric: She has a normal mood and affect. Her behavior is normal. Judgment and thought content normal.          Assessment & Plan:

## 2013-11-22 LAB — THYROID PANEL WITH TSH
Free Thyroxine Index: 3.2 (ref 1.2–4.9)
T3 UPTAKE RATIO: 29 % (ref 24–39)
T4, Total: 11 ug/dL (ref 4.5–12.0)
TSH: 1.23 u[IU]/mL (ref 0.450–4.500)

## 2013-11-22 LAB — CMP14+EGFR
ALT: 20 IU/L (ref 0–32)
AST: 19 IU/L (ref 0–40)
Albumin/Globulin Ratio: 1.8 (ref 1.1–2.5)
Albumin: 4.4 g/dL (ref 3.5–5.5)
Alkaline Phosphatase: 67 IU/L (ref 39–117)
BUN/Creatinine Ratio: 15 (ref 9–23)
BUN: 13 mg/dL (ref 6–24)
CALCIUM: 9.3 mg/dL (ref 8.7–10.2)
CHLORIDE: 101 mmol/L (ref 97–108)
CO2: 26 mmol/L (ref 18–29)
Creatinine, Ser: 0.88 mg/dL (ref 0.57–1.00)
GFR calc Af Amer: 92 mL/min/{1.73_m2} (ref >59)
GFR calc non Af Amer: 80 mL/min/{1.73_m2} (ref >59)
GLUCOSE: 104 mg/dL — AB (ref 65–99)
Globulin, Total: 2.4 g/dL (ref 1.5–4.5)
POTASSIUM: 3.9 mmol/L (ref 3.5–5.2)
Sodium: 140 mmol/L (ref 134–144)
TOTAL PROTEIN: 6.8 g/dL (ref 6.0–8.5)
Total Bilirubin: <0.2 mg/dL (ref 0.0–1.2)

## 2013-11-22 LAB — NMR, LIPOPROFILE
Cholesterol: 176 mg/dL (ref <200)
HDL CHOLESTEROL BY NMR: 44 mg/dL (ref >=40)
HDL Particle Number: 30.3 umol/L — ABNORMAL LOW (ref >=30.5)
LDL PARTICLE NUMBER: 1227 nmol/L — AB (ref <1000)
LDL SIZE: 20.8 nm (ref >20.5)
LDLC SERPL CALC-MCNC: 106 mg/dL — ABNORMAL HIGH (ref <100)
LP-IR Score: 42 (ref <=45)
Small LDL Particle Number: 583 nmol/L — ABNORMAL HIGH (ref <=527)
TRIGLYCERIDES BY NMR: 129 mg/dL (ref <150)

## 2013-11-22 LAB — MEASLES/MUMPS/RUBELLA IMMUNITY: RUBELLA: 2.81 {index} (ref >0.99)

## 2013-11-22 LAB — VARICELLA ZOSTER ANTIBODY, IGG: Varicella zoster IgG: 562 index (ref >165)

## 2013-11-28 ENCOUNTER — Ambulatory Visit (INDEPENDENT_AMBULATORY_CARE_PROVIDER_SITE_OTHER): Payer: BC Managed Care – PPO | Admitting: *Deleted

## 2013-11-28 ENCOUNTER — Telehealth: Payer: Self-pay | Admitting: Family Medicine

## 2013-11-28 ENCOUNTER — Other Ambulatory Visit: Payer: Self-pay | Admitting: Nurse Practitioner

## 2013-11-28 DIAGNOSIS — Z111 Encounter for screening for respiratory tuberculosis: Secondary | ICD-10-CM

## 2013-11-28 MED ORDER — AZITHROMYCIN 250 MG PO TABS: ORAL_TABLET | ORAL | Status: DC

## 2013-11-28 NOTE — Progress Notes (Signed)
Tb skin test placed and patient tolerated well

## 2013-11-28 NOTE — Telephone Encounter (Signed)
rx sent to pharmacy

## 2013-11-28 NOTE — Telephone Encounter (Signed)
Message copied by FULP, ASHLEY on Tue Nov 28, 2013  9:36 AM ------      Message from: MARTIN, MARY-MARGARET      Created: Thu Nov 23, 2013  8:13 AM       Cbc- hgb low- OTC iron daily      Kidney and liver function stable      LDL particle numbers slightly elevated- low fat diet and exercise- recheck in 6  months      Thyroid normal      Varicella- positive immunity      Rubella- positive immunity      Rubeola- negative immunity      Mumps- negative immunity      # School may want you to take another MMR due to rubeola and mumps negative but they are usually more concerned about rubella titer and it is ok- Just see what they say- pt needs copy of titers       ------ 

## 2013-11-28 NOTE — Patient Instructions (Signed)

## 2013-11-28 NOTE — Telephone Encounter (Signed)
Wants a zpack called is sick she has a bad cold

## 2013-11-30 ENCOUNTER — Encounter: Payer: Self-pay | Admitting: *Deleted

## 2013-11-30 LAB — TB SKIN TEST
INDURATION: 0 mm
TB SKIN TEST: NEGATIVE

## 2014-01-15 ENCOUNTER — Telehealth: Payer: Self-pay | Admitting: Nurse Practitioner

## 2014-01-23 NOTE — Telephone Encounter (Signed)
Detailed message left to call back if still needs to be seen

## 2014-02-12 ENCOUNTER — Telehealth: Payer: Self-pay | Admitting: Nurse Practitioner

## 2014-02-12 NOTE — Telephone Encounter (Signed)
Appt given for tomorrow per patients request 

## 2014-02-13 ENCOUNTER — Ambulatory Visit (INDEPENDENT_AMBULATORY_CARE_PROVIDER_SITE_OTHER): Payer: BC Managed Care – PPO | Admitting: Nurse Practitioner

## 2014-02-13 ENCOUNTER — Encounter: Payer: Self-pay | Admitting: Nurse Practitioner

## 2014-02-13 VITALS — BP 125/80 | HR 72 | Temp 97.6°F | Ht 62.5 in | Wt 182.8 lb

## 2014-02-13 DIAGNOSIS — G8929 Other chronic pain: Secondary | ICD-10-CM

## 2014-02-13 DIAGNOSIS — M545 Low back pain, unspecified: Secondary | ICD-10-CM

## 2014-02-13 DIAGNOSIS — N62 Hypertrophy of breast: Secondary | ICD-10-CM

## 2014-02-13 NOTE — Progress Notes (Signed)
   Subjective:    Patient ID: Anita Moreno, female    DOB: 1966/01/13, 48 y.o.   MRN: [id number]  HPI Patient here today with low back pain- she has had surgery in the past on her back and fell last fall which has caused things to shift in her back and she has chronic back pain- her surgeon said needs back surgery which is not possible right now- They suggested a breast reduction which will help take pressure off of lower back- SHe currently has indentations in shoulders and wears a F Cup bra.     Review of Systems  Constitutional: Negative.   HENT: Negative.   Respiratory: Negative.   Cardiovascular: Negative.   Musculoskeletal: Positive for back pain.  Skin: Negative.   Psychiatric/Behavioral: Negative.   All other systems reviewed and are negative.      Objective:   Physical Exam  Constitutional: She is oriented to person, place, and time. She appears well-developed and well-nourished.  Cardiovascular: Normal rate, regular rhythm and normal heart sounds.   Pulmonary/Chest: Effort normal and breath sounds normal.  Musculoskeletal:  Lower back pain on full flexion and extension. No point tenderness (+) Slr bil at 45 degrees Motor strength and sensation distally intact  Neurological: She is alert and oriented to person, place, and time.  Skin: Skin is warm and dry.  Indentations in bil soulders secondary to bra  Psychiatric: She has a normal mood and affect. Her behavior is normal. Judgment and thought content normal.   BP 125/80  Pulse 72  Temp(Src) 97.6 F (36.4 C) (Oral)  Ht 5' 2.5" (1.588 m)  Wt 182 lb 12.8 oz (82.918 kg)  BMI 32.88 kg/m2  LMP 01/06/2014        Assessment & Plan:   1. Gynecomastia, female   2. Chronic low back pain    Letter written for breast reduction surgery Folow up prn  Mary-Margaret Hassell Done, FNP

## 2014-04-16 ENCOUNTER — Other Ambulatory Visit: Payer: Self-pay | Admitting: Nurse Practitioner

## 2014-04-17 NOTE — Telephone Encounter (Signed)
Celexa is not on patients current med list. Looks like last refill was 11-14. Please advise on refill

## 2014-04-18 NOTE — Telephone Encounter (Signed)
no more refills without being seen  

## 2014-04-23 ENCOUNTER — Ambulatory Visit: Payer: BC Managed Care – PPO

## 2014-04-30 ENCOUNTER — Ambulatory Visit: Payer: BC Managed Care – PPO

## 2014-07-18 ENCOUNTER — Other Ambulatory Visit: Payer: Self-pay | Admitting: Nurse Practitioner

## 2014-10-30 ENCOUNTER — Other Ambulatory Visit: Payer: Self-pay | Admitting: Family Medicine

## 2014-11-12 ENCOUNTER — Ambulatory Visit (INDEPENDENT_AMBULATORY_CARE_PROVIDER_SITE_OTHER): Payer: BLUE CROSS/BLUE SHIELD | Admitting: *Deleted

## 2014-11-12 DIAGNOSIS — Z0000 Encounter for general adult medical examination without abnormal findings: Secondary | ICD-10-CM

## 2014-11-12 DIAGNOSIS — Z0189 Encounter for other specified special examinations: Secondary | ICD-10-CM

## 2014-11-15 ENCOUNTER — Encounter: Payer: Self-pay | Admitting: *Deleted

## 2014-11-15 LAB — TB SKIN TEST
INDURATION: 0 mm
TB Skin Test: NEGATIVE

## 2014-11-27 ENCOUNTER — Other Ambulatory Visit: Payer: BLUE CROSS/BLUE SHIELD | Admitting: Nurse Practitioner

## 2014-12-25 ENCOUNTER — Encounter (INDEPENDENT_AMBULATORY_CARE_PROVIDER_SITE_OTHER): Payer: Self-pay

## 2014-12-25 ENCOUNTER — Ambulatory Visit (INDEPENDENT_AMBULATORY_CARE_PROVIDER_SITE_OTHER): Payer: BLUE CROSS/BLUE SHIELD | Admitting: Family

## 2014-12-25 ENCOUNTER — Encounter: Payer: Self-pay | Admitting: Family

## 2014-12-25 VITALS — BP 130/81 | HR 68 | Temp 97.7°F | Ht 62.5 in | Wt 176.6 lb

## 2014-12-25 DIAGNOSIS — Z0000 Encounter for general adult medical examination without abnormal findings: Secondary | ICD-10-CM | POA: Diagnosis not present

## 2014-12-25 DIAGNOSIS — F411 Generalized anxiety disorder: Secondary | ICD-10-CM | POA: Diagnosis not present

## 2014-12-25 LAB — POCT CBC
GRANULOCYTE PERCENT: 60.7 % (ref 37–80)
HEMATOCRIT: 36.8 % — AB (ref 37.7–47.9)
Hemoglobin: 12.1 g/dL — AB (ref 12.2–16.2)
LYMPH, POC: 2.2 (ref 0.6–3.4)
MCH, POC: 27.3 pg (ref 27–31.2)
MCHC: 33 g/dL (ref 31.8–35.4)
MCV: 82.8 fL (ref 80–97)
MPV: 7.2 fL (ref 0–99.8)
POC GRANULOCYTE: 3.9 (ref 2–6.9)
POC LYMPH PERCENT: 33.5 %L (ref 10–50)
Platelet Count, POC: 267 10*3/uL (ref 142–424)
RBC: 4.45 M/uL (ref 4.04–5.48)
RDW, POC: 15.7 %
WBC: 6.5 10*3/uL (ref 4.6–10.2)

## 2014-12-25 MED ORDER — CITALOPRAM HYDROBROMIDE 20 MG PO TABS: 20.0000 mg | ORAL_TABLET | Freq: Every day | ORAL | Status: DC

## 2014-12-25 NOTE — Patient Instructions (Signed)

## 2014-12-25 NOTE — Progress Notes (Signed)
   Subjective:    Patient ID: Anita Moreno, female    DOB: June 19, 1966, 49 y.o.   MRN: [id number]  PT presents to the office today for CPE without pap. Pt currently taking Celexa for GAD with no complaints at this time.  Anxiety Presents for follow-up visit. Patient reports no depressed mood, excessive worry, insomnia, nervous/anxious behavior, palpitations, panic, restlessness or shortness of breath. Symptoms occur rarely. The severity of symptoms is mild. The symptoms are aggravated by work stress.   Her past medical history is significant for anxiety/panic attacks. Past treatments include SSRIs. The treatment provided significant relief. Compliance with prior treatments has been good.      Review of Systems  Constitutional: Negative.   HENT: Negative.   Eyes: Negative.   Respiratory: Negative.  Negative for shortness of breath.   Cardiovascular: Negative.  Negative for palpitations.  Gastrointestinal: Negative.   Endocrine: Negative.   Genitourinary: Negative.   Musculoskeletal: Negative.   Neurological: Negative.  Negative for headaches.  Hematological: Negative.   Psychiatric/Behavioral: Negative.  The patient is not nervous/anxious and does not have insomnia.   All other systems reviewed and are negative.      Objective:   Physical Exam  Constitutional: She is oriented to person, place, and time. She appears well-developed and well-nourished. No distress.  HENT:  Head: Normocephalic and atraumatic.  Right Ear: External ear normal.  Left Ear: External ear normal.  Nose: Nose normal.  Mouth/Throat: Oropharynx is clear and moist.  Eyes: Pupils are equal, round, and reactive to light.  Neck: Normal range of motion. Neck supple. No thyromegaly present.  Cardiovascular: Normal rate, regular rhythm, normal heart sounds and intact distal pulses.   No murmur heard. Pulmonary/Chest: Effort normal and breath sounds normal. No respiratory distress. She has no wheezes.    Abdominal: Soft. Bowel sounds are normal. She exhibits no distension. There is no tenderness.  Musculoskeletal: Normal range of motion. She exhibits no edema or tenderness.  Neurological: She is alert and oriented to person, place, and time. She has normal reflexes. No cranial nerve deficit.  Skin: Skin is warm and dry.  Psychiatric: She has a normal mood and affect. Her behavior is normal. Judgment and thought content normal.  Vitals reviewed.   BP 130/81 mmHg  Pulse 68  Temp(Src) 97.7 F (36.5 C) (Oral)  Ht 5' 2.5" (1.588 m)  Wt 176 lb 9.6 oz (80.105 kg)  BMI 31.77 kg/m2       Assessment & Plan:  1. GAD (generalized anxiety disorder) - CMP14+EGFR - citalopram (CELEXA) 20 MG tablet; Take 1 tablet (20 mg total) by mouth daily.  Dispense: 90 tablet; Refill: 4  2. Annual physical exam - POCT CBC - CMP14+EGFR - Lipid panel - Thyroid Panel With TSH - Vit D  25 hydroxy (rtn osteoporosis monitoring)   Continue all meds Labs pending Health Maintenance reviewed Diet and exercise encouraged RTO 1 year  Evelina Dun, FNP

## 2014-12-26 ENCOUNTER — Telehealth: Payer: Self-pay | Admitting: *Deleted

## 2014-12-26 ENCOUNTER — Other Ambulatory Visit: Payer: Self-pay | Admitting: Family

## 2014-12-26 DIAGNOSIS — E785 Hyperlipidemia, unspecified: Secondary | ICD-10-CM | POA: Insufficient documentation

## 2014-12-26 LAB — CMP14+EGFR
A/G RATIO: 1.8 (ref 1.1–2.5)
ALBUMIN: 4.4 g/dL (ref 3.5–5.5)
ALT: 15 IU/L (ref 0–32)
AST: 11 IU/L (ref 0–40)
Alkaline Phosphatase: 69 IU/L (ref 39–117)
BUN/Creatinine Ratio: 14 (ref 9–23)
BUN: 10 mg/dL (ref 6–24)
Bilirubin Total: 0.3 mg/dL (ref 0.0–1.2)
CHLORIDE: 100 mmol/L (ref 97–108)
CO2: 26 mmol/L (ref 18–29)
Calcium: 9.3 mg/dL (ref 8.7–10.2)
Creatinine, Ser: 0.7 mg/dL (ref 0.57–1.00)
GFR, EST AFRICAN AMERICAN: 120 mL/min/{1.73_m2} (ref >59)
GFR, EST NON AFRICAN AMERICAN: 104 mL/min/{1.73_m2} (ref >59)
Globulin, Total: 2.5 g/dL (ref 1.5–4.5)
Glucose: 85 mg/dL (ref 65–99)
Potassium: 4.7 mmol/L (ref 3.5–5.2)
SODIUM: 139 mmol/L (ref 134–144)
TOTAL PROTEIN: 6.9 g/dL (ref 6.0–8.5)

## 2014-12-26 LAB — LIPID PANEL
CHOL/HDL RATIO: 4.4 ratio (ref 0.0–4.4)
Cholesterol, Total: 185 mg/dL (ref 100–199)
HDL: 42 mg/dL (ref >39)
LDL CALC: 125 mg/dL — AB (ref 0–99)
Triglycerides: 92 mg/dL (ref 0–149)
VLDL CHOLESTEROL CAL: 18 mg/dL (ref 5–40)

## 2014-12-26 LAB — VITAMIN D 25 HYDROXY (VIT D DEFICIENCY, FRACTURES): Vit D, 25-Hydroxy: 45.2 ng/mL (ref 30.0–100.0)

## 2014-12-26 LAB — THYROID PANEL WITH TSH
FREE THYROXINE INDEX: 2.9 (ref 1.2–4.9)
T3 UPTAKE RATIO: 25 % (ref 24–39)
T4, Total: 11.6 ug/dL (ref 4.5–12.0)
TSH: 0.917 u[IU]/mL (ref 0.450–4.500)

## 2014-12-26 MED ORDER — SIMVASTATIN 20 MG PO TABS: 20.0000 mg | ORAL_TABLET | Freq: Every day | ORAL | Status: DC

## 2014-12-26 NOTE — Telephone Encounter (Signed)
-----   Message from Sharion Balloon, Yaak sent at 12/26/2014  8:52 AM EDT ----- CBC (WBC, Hgb, and Plts) Stable Kidney and liver function stable LDL levels elevated- Pt needs to be on low fat diet- Zocor rx sent to pharmacy Thyroid levels WNL Vit D levels WNL

## 2014-12-27 NOTE — Progress Notes (Signed)
Patient aware.

## 2015-03-04 ENCOUNTER — Telehealth: Payer: Self-pay | Admitting: Family

## 2015-03-04 NOTE — Telephone Encounter (Signed)
Left message on patients voicemail that [facility] was on the same system we were on and they should be able to see her information.

## 2015-05-27 ENCOUNTER — Other Ambulatory Visit: Payer: Self-pay

## 2015-05-27 DIAGNOSIS — Z1231 Encounter for screening mammogram for malignant neoplasm of breast: Secondary | ICD-10-CM

## 2015-05-30 ENCOUNTER — Ambulatory Visit: Payer: Self-pay

## 2015-06-03 ENCOUNTER — Ambulatory Visit
Admission: RE | Admit: 2015-06-03 | Discharge: 2015-06-03 | Disposition: A | Discharge status: Home / Self Care | Payer: BLUE CROSS/BLUE SHIELD | Source: Ambulatory Visit

## 2015-06-03 DIAGNOSIS — Z1231 Encounter for screening mammogram for malignant neoplasm of breast: Secondary | ICD-10-CM

## 2015-11-26 ENCOUNTER — Ambulatory Visit (INDEPENDENT_AMBULATORY_CARE_PROVIDER_SITE_OTHER): Payer: BLUE CROSS/BLUE SHIELD | Admitting: Family

## 2015-11-26 ENCOUNTER — Encounter: Payer: Self-pay | Admitting: Family

## 2015-11-26 VITALS — BP 148/82 | HR 72 | Temp 97.7°F | Ht 63.5 in | Wt 174.0 lb

## 2015-11-26 DIAGNOSIS — E785 Hyperlipidemia, unspecified: Secondary | ICD-10-CM

## 2015-11-26 DIAGNOSIS — I10 Essential (primary) hypertension: Secondary | ICD-10-CM | POA: Diagnosis not present

## 2015-11-26 DIAGNOSIS — F411 Generalized anxiety disorder: Secondary | ICD-10-CM

## 2015-11-26 MED ORDER — HYDROCHLOROTHIAZIDE 25 MG PO TABS: 25.0000 mg | ORAL_TABLET | Freq: Every day | ORAL | Status: DC

## 2015-11-26 NOTE — Progress Notes (Signed)
Subjective:    Patient ID: Anita Moreno, female    DOB: Dec 11, 1965, 50 y.o.   MRN: [id number]  PT presents to the office today for chronic follow up. PT states her BP has been running high" at home. PT states her BP 190/82 yesterday and states "everyone in my family is on blood pressure medication". Pt states she has cut salt out of her diet and stopped drinking soft drinks with no relief.  Hypertension This is a new problem. The current episode started more than 1 month ago. The problem is unchanged. The problem is uncontrolled. Associated symptoms include anxiety, blurred vision, headaches and palpitations. Pertinent negatives include no peripheral edema, shortness of breath or sweats. Risk factors for coronary artery disease include dyslipidemia, family history, obesity and stress. Past treatments include nothing. The current treatment provides no improvement. There is no history of kidney disease, CAD/MI, CVA, heart failure, retinopathy or a thyroid problem. There is no history of sleep apnea.  Hyperlipidemia This is a chronic problem. The current episode started more than 1 year ago. The problem is uncontrolled. Recent lipid tests were reviewed and are high. Pertinent negatives include no shortness of breath. Current antihyperlipidemic treatment includes statins. The current treatment provides mild improvement of lipids. Risk factors for coronary artery disease include dyslipidemia, family history, hypertension and obesity.  Anxiety Presents for follow-up visit. The problem has been resolved. Symptoms include palpitations. Patient reports no depressed mood, excessive worry, irritability, nervous/anxious behavior or shortness of breath. Symptoms occur rarely.   Her past medical history is significant for anxiety/panic attacks. Past treatments include SSRIs. The treatment provided significant relief. Compliance with prior treatments has been good.      Review of Systems  Constitutional:  Negative.  Negative for irritability.  Eyes: Positive for blurred vision.  Respiratory: Negative.  Negative for shortness of breath.   Cardiovascular: Positive for palpitations.  Gastrointestinal: Negative.   Endocrine: Negative.   Genitourinary: Negative.   Musculoskeletal: Negative.   Neurological: Positive for headaches.  Hematological: Negative.   Psychiatric/Behavioral: Negative.  The patient is not nervous/anxious.   All other systems reviewed and are negative.      Objective:   Physical Exam  Constitutional: She is oriented to person, place, and time. She appears well-developed and well-nourished. No distress.  HENT:  Head: Normocephalic and atraumatic.  Right Ear: External ear normal.  Left Ear: External ear normal.  Nose: Nose normal.  Mouth/Throat: Oropharynx is clear and moist.  Eyes: Pupils are equal, round, and reactive to light.  Neck: Normal range of motion. Neck supple. No thyromegaly present.  Cardiovascular: Normal rate, regular rhythm, normal heart sounds and intact distal pulses.   No murmur heard. Pulmonary/Chest: Effort normal and breath sounds normal. No respiratory distress. She has no wheezes.  Abdominal: Soft. Bowel sounds are normal. She exhibits no distension. There is no tenderness.  Musculoskeletal: Normal range of motion. She exhibits no edema or tenderness.  Neurological: She is alert and oriented to person, place, and time. She has normal reflexes. No cranial nerve deficit.  Skin: Skin is warm and dry.  Psychiatric: She has a normal mood and affect. Her behavior is normal. Judgment and thought content normal.  Vitals reviewed.   BP 148/82 mmHg  Pulse 72  Temp(Src) 97.7 F (36.5 C) (Oral)  Ht 5' 3.5" (1.613 m)  Wt 174 lb (78.926 kg)  BMI 30.34 kg/m2       Assessment & Plan:  1. GAD (generalized anxiety disorder) - CMP14+EGFR  2. Hyperlipidemia - CMP14+EGFR - Lipid panel  3. Essential hypertension -PT started on Stuart on 25 mg  today --Daily blood pressure log given with instructions on how to fill out and told to bring to next visit -Dash diet information given -Exercise encouraged - Stress Management  -Continue current meds -RTO in 2 weeks - hydrochlorothiazide (HYDRODIURIL) 25 MG tablet; Take 1 tablet (25 mg total) by mouth daily.  Dispense: 90 tablet; Refill: 3 - CMP14+EGFR   Continue all meds Labs pending Health Maintenance reviewed Diet and exercise encouraged RTO 2 weeks to recheck HTN  Evelina Dun, FNP

## 2015-11-26 NOTE — Patient Instructions (Signed)
DASH Eating Plan °DASH stands for "Dietary Approaches to Stop Hypertension." The DASH eating plan is a healthy eating plan that has been shown to reduce high blood pressure (hypertension). Additional health benefits may include reducing the risk of type 2 diabetes mellitus, heart disease, and stroke. The DASH eating plan may also help with weight loss. °WHAT DO I NEED TO KNOW ABOUT THE DASH EATING PLAN? °For the DASH eating plan, you will follow these general guidelines: °· Choose foods with a percent daily value for sodium of less than 5% (as listed on the food label). °· Use salt-free seasonings or herbs instead of table salt or sea salt. °· Check with your health care provider or pharmacist before using salt substitutes. °· Eat lower-sodium products, often labeled as "lower sodium" or "no salt added." °· Eat fresh foods. °· Eat more vegetables, fruits, and low-fat dairy products. °· Choose whole grains. Look for the word "whole" as the first word in the ingredient list. °· Choose fish and skinless chicken or turkey more often than red meat. Limit fish, poultry, and meat to 6 oz (170 g) each day. °· Limit sweets, desserts, sugars, and sugary drinks. °· Choose heart-healthy fats. °· Limit cheese to 1 oz (28 g) per day. °· Eat more home-cooked food and less restaurant, buffet, and fast food. °· Limit fried foods. °· Cook foods using methods other than frying. °· Limit canned vegetables. If you do use them, rinse them well to decrease the sodium. °· When eating at a restaurant, ask that your food be prepared with less salt, or no salt if possible. °WHAT FOODS CAN I EAT? °Seek help from a dietitian for individual calorie needs. °Grains °Whole grain or whole wheat bread. Brown rice. Whole grain or whole wheat pasta. Quinoa, bulgur, and whole grain cereals. Low-sodium cereals. Corn or whole wheat flour tortillas. Whole grain cornbread. Whole grain crackers. Low-sodium crackers. °Vegetables °Fresh or frozen vegetables  (raw, steamed, roasted, or grilled). Low-sodium or reduced-sodium tomato and vegetable juices. Low-sodium or reduced-sodium tomato sauce and paste. Low-sodium or reduced-sodium canned vegetables.  °Fruits °All fresh, canned (in natural juice), or frozen fruits. °Meat and Other Protein Products °Ground beef (85% or leaner), grass-fed beef, or beef trimmed of fat. Skinless chicken or turkey. Ground chicken or turkey. Pork trimmed of fat. All fish and seafood. Eggs. Dried beans, peas, or lentils. Unsalted nuts and seeds. Unsalted canned beans. °Dairy °Low-fat dairy products, such as skim or 1% milk, 2% or reduced-fat cheeses, low-fat ricotta or cottage cheese, or plain low-fat yogurt. Low-sodium or reduced-sodium cheeses. °Fats and Oils °Tub margarines without trans fats. Light or reduced-fat mayonnaise and salad dressings (reduced sodium). Avocado. Safflower, olive, or canola oils. Natural peanut or almond butter. °Other °Unsalted popcorn and pretzels. °The items listed above may not be a complete list of recommended foods or beverages. Contact your dietitian for more options. °WHAT FOODS ARE NOT RECOMMENDED? °Grains °White bread. White pasta. White rice. Refined cornbread. Bagels and croissants. Crackers that contain trans fat. °Vegetables °Creamed or fried vegetables. Vegetables in a cheese sauce. Regular canned vegetables. Regular canned tomato sauce and paste. Regular tomato and vegetable juices. °Fruits °Dried fruits. Canned fruit in light or heavy syrup. Fruit juice. °Meat and Other Protein Products °Fatty cuts of meat. Ribs, chicken wings, bacon, sausage, bologna, salami, chitterlings, fatback, hot dogs, bratwurst, and packaged luncheon meats. Salted nuts and seeds. Canned beans with salt. °Dairy °Whole or 2% milk, cream, half-and-half, and cream cheese. Whole-fat or sweetened yogurt. Full-fat   cheeses or blue cheese. Nondairy creamers and whipped toppings. Processed cheese, cheese spreads, or cheese  curds. °Condiments °Onion and garlic salt, seasoned salt, table salt, and sea salt. Canned and packaged gravies. Worcestershire sauce. Tartar sauce. Barbecue sauce. Teriyaki sauce. Soy sauce, including reduced sodium. Steak sauce. Fish sauce. Oyster sauce. Cocktail sauce. Horseradish. Ketchup and mustard. Meat flavorings and tenderizers. Bouillon cubes. Hot sauce. Tabasco sauce. Marinades. Taco seasonings. Relishes. °Fats and Oils °Butter, stick margarine, lard, shortening, ghee, and bacon fat. Coconut, palm kernel, or palm oils. Regular salad dressings. °Other °Pickles and olives. Salted popcorn and pretzels. °The items listed above may not be a complete list of foods and beverages to avoid. Contact your dietitian for more information. °WHERE CAN I FIND MORE INFORMATION? °National [employer], [employer], [employer] Institute: www.nhlbi.nih.gov/health/health-topics/topics/dash/ °  °This information is not intended to replace advice given to you by your health care provider. Make sure you discuss any questions you have with your health care provider. °  °Document Released: 06/25/2011 Document Revised: 07/27/2014 Document Reviewed: 05/10/2013 °Elsevier Interactive Patient Education ©2016 [employer] Inc. ° °Hypertension °Hypertension, commonly called high blood pressure, is when the force of blood pumping through your arteries is too strong. Your arteries are the blood vessels that carry blood from your heart throughout your body. A blood pressure reading consists of a higher number over a lower number, such as 110/72. The higher number (systolic) is the pressure inside your arteries when your heart pumps. The lower number (diastolic) is the pressure inside your arteries when your heart relaxes. Ideally you want your blood pressure below 120/80. °Hypertension forces your heart to work harder to pump blood. Your arteries may become narrow or stiff. Having untreated or uncontrolled hypertension can cause heart attack, stroke, kidney  disease, and other problems. °RISK FACTORS °Some risk factors for high blood pressure are controllable. Others are not.  °Risk factors you cannot control include:  °· Race. You may be at higher risk if you are African American. °· Age. Risk increases with age. °· Gender. Men are at higher risk than women before age 45 years. After age 65, women are at higher risk than men. °Risk factors you can control include: °· Not getting enough exercise or physical activity. °· Being overweight. °· Getting too much fat, sugar, calories, or salt in your diet. °· Drinking too much alcohol. °SIGNS AND SYMPTOMS °Hypertension does not usually cause signs or symptoms. Extremely high blood pressure (hypertensive crisis) may cause headache, anxiety, shortness of breath, and nosebleed. °DIAGNOSIS °To check if you have hypertension, your health care provider will measure your blood pressure while you are seated, with your arm held at the level of your heart. It should be measured at least twice using the same arm. Certain conditions can cause a difference in blood pressure between your right and left arms. A blood pressure reading that is higher than normal on one occasion does not mean that you need treatment. If it is not clear whether you have high blood pressure, you may be asked to return on a different day to have your blood pressure checked again. Or, you may be asked to monitor your blood pressure at home for 1 or more weeks. °TREATMENT °Treating high blood pressure includes making lifestyle changes and possibly taking medicine. Living a healthy lifestyle can help lower high blood pressure. You may need to change some of your habits. °Lifestyle changes may include: °· Following the DASH diet. This diet is high in fruits, vegetables, and whole   grains. It is low in salt, red meat, and added sugars. °· Keep your sodium intake below 2,300 mg per day. °· Getting at least 30-45 minutes of aerobic exercise at least 4 times per  week. °· Losing weight if necessary. °· Not smoking. °· Limiting alcoholic beverages. °· Learning ways to reduce stress. °Your health care provider may prescribe medicine if lifestyle changes are not enough to get your blood pressure under control, and if one of the following is true: °· You are 18-59 years of age and your systolic blood pressure is above 140. °· You are 60 years of age or older, and your systolic blood pressure is above 150. °· Your diastolic blood pressure is above 90. °· You have diabetes, and your systolic blood pressure is over 140 or your diastolic blood pressure is over 90. °· You have kidney disease and your blood pressure is above 140/90. °· You have heart disease and your blood pressure is above 140/90. °Your personal target blood pressure may vary depending on your medical conditions, your age, and other factors. °HOME CARE INSTRUCTIONS °· Have your blood pressure rechecked as directed by your health care provider.   °· Take medicines only as directed by your health care provider. Follow the directions carefully. Blood pressure medicines must be taken as prescribed. The medicine does not work as well when you skip doses. Skipping doses also puts you at risk for problems. °· Do not smoke.   °· Monitor your blood pressure at home as directed by your health care provider.  °SEEK MEDICAL CARE IF:  °· You think you are having a reaction to medicines taken. °· You have recurrent headaches or feel dizzy. °· You have swelling in your ankles. °· You have trouble with your vision. °SEEK IMMEDIATE MEDICAL CARE IF: °· You develop a severe headache or confusion. °· You have unusual weakness, numbness, or feel faint. °· You have severe chest or abdominal pain. °· You vomit repeatedly. °· You have trouble breathing. °MAKE SURE YOU:  °· Understand these instructions. °· Will watch your condition. °· Will get help right away if you are not doing well or get worse. °  °This information is not intended to  replace advice given to you by your health care provider. Make sure you discuss any questions you have with your health care provider. °  °Document Released: 07/06/2005 Document Revised: 11/20/2014 Document Reviewed: 04/28/2013 °Elsevier Interactive Patient Education ©2016 Elsevier Inc. ° °

## 2015-11-27 ENCOUNTER — Ambulatory Visit: Payer: BLUE CROSS/BLUE SHIELD | Admitting: Family

## 2015-11-27 LAB — LIPID PANEL
CHOLESTEROL TOTAL: 191 mg/dL (ref 100–199)
Chol/HDL Ratio: 4.4 ratio units (ref 0.0–4.4)
HDL: 43 mg/dL (ref >39)
LDL Calculated: 127 mg/dL — ABNORMAL HIGH (ref 0–99)
Triglycerides: 103 mg/dL (ref 0–149)
VLDL CHOLESTEROL CAL: 21 mg/dL (ref 5–40)

## 2015-11-27 LAB — CMP14+EGFR
ALBUMIN: 4.4 g/dL (ref 3.5–5.5)
ALK PHOS: 68 IU/L (ref 39–117)
ALT: 16 IU/L (ref 0–32)
AST: 16 IU/L (ref 0–40)
Albumin/Globulin Ratio: 1.8 (ref 1.2–2.2)
BUN / CREAT RATIO: 14 (ref 9–23)
BUN: 10 mg/dL (ref 6–24)
Bilirubin Total: 0.4 mg/dL (ref 0.0–1.2)
CHLORIDE: 97 mmol/L (ref 96–106)
CO2: 24 mmol/L (ref 18–29)
CREATININE: 0.73 mg/dL (ref 0.57–1.00)
Calcium: 9.4 mg/dL (ref 8.7–10.2)
GFR calc Af Amer: 113 mL/min/{1.73_m2} (ref >59)
GFR calc non Af Amer: 98 mL/min/{1.73_m2} (ref >59)
Globulin, Total: 2.5 g/dL (ref 1.5–4.5)
Glucose: 94 mg/dL (ref 65–99)
Potassium: 4.3 mmol/L (ref 3.5–5.2)
Sodium: 139 mmol/L (ref 134–144)
Total Protein: 6.9 g/dL (ref 6.0–8.5)

## 2015-12-09 ENCOUNTER — Ambulatory Visit (INDEPENDENT_AMBULATORY_CARE_PROVIDER_SITE_OTHER): Payer: BLUE CROSS/BLUE SHIELD | Admitting: Family

## 2015-12-09 ENCOUNTER — Encounter: Payer: Self-pay | Admitting: Family

## 2015-12-09 VITALS — BP 138/80 | HR 65 | Temp 97.0°F | Ht 63.5 in | Wt 170.6 lb

## 2015-12-09 DIAGNOSIS — E785 Hyperlipidemia, unspecified: Secondary | ICD-10-CM

## 2015-12-09 DIAGNOSIS — E663 Overweight: Secondary | ICD-10-CM | POA: Diagnosis not present

## 2015-12-09 DIAGNOSIS — I10 Essential (primary) hypertension: Secondary | ICD-10-CM

## 2015-12-09 DIAGNOSIS — E669 Obesity, unspecified: Secondary | ICD-10-CM | POA: Insufficient documentation

## 2015-12-09 LAB — BMP8+EGFR
BUN/Creatinine Ratio: 14 (ref 9–23)
BUN: 10 mg/dL (ref 6–24)
CALCIUM: 9.4 mg/dL (ref 8.7–10.2)
CHLORIDE: 97 mmol/L (ref 96–106)
CO2: 26 mmol/L (ref 18–29)
Creatinine, Ser: 0.69 mg/dL (ref 0.57–1.00)
GFR calc Af Amer: 120 mL/min/{1.73_m2} (ref >59)
GFR, EST NON AFRICAN AMERICAN: 104 mL/min/{1.73_m2} (ref >59)
GLUCOSE: 67 mg/dL (ref 65–99)
POTASSIUM: 3.9 mmol/L (ref 3.5–5.2)
Sodium: 140 mmol/L (ref 134–144)

## 2015-12-09 MED ORDER — ATORVASTATIN CALCIUM 20 MG PO TABS: 20.0000 mg | ORAL_TABLET | Freq: Every day | ORAL | Status: DC

## 2015-12-09 NOTE — Progress Notes (Signed)
   Subjective:    Patient ID: Anita Moreno, female    DOB: 07-Mar-1966, 50 y.o.   MRN: [id number]  Pt presents to the office today to recheck HTN. PT's BP is not at goal Hypertension This is a chronic problem. The current episode started more than 1 year ago. The problem has been resolved since onset. The problem is uncontrolled. Associated symptoms include headaches. Pertinent negatives include no anxiety, blurred vision, palpitations, peripheral edema or shortness of breath. Past treatments include diuretics. There is no history of kidney disease, CAD/MI, CVA, heart failure or a thyroid problem. There is no history of sleep apnea.  Hyperlipidemia This is a chronic problem. The current episode started more than 1 year ago. The problem is uncontrolled. Recent lipid tests were reviewed and are high. Pertinent negatives include no shortness of breath. Current antihyperlipidemic treatment includes statins. The current treatment provides mild improvement of lipids. Risk factors for coronary artery disease include dyslipidemia, family history, hypertension and obesity.      Review of Systems  Eyes: Negative for blurred vision.  Respiratory: Negative for shortness of breath.   Cardiovascular: Negative for palpitations.  Neurological: Positive for headaches.  All other systems reviewed and are negative.      Objective:   Physical Exam  Constitutional: She is oriented to person, place, and time. She appears well-developed and well-nourished. No distress.  HENT:  Head: Normocephalic and atraumatic.  Eyes: Pupils are equal, round, and reactive to light.  Neck: Normal range of motion. Neck supple. No thyromegaly present.  Cardiovascular: Normal rate, regular rhythm, normal heart sounds and intact distal pulses.   No murmur heard. Pulmonary/Chest: Effort normal and breath sounds normal. No respiratory distress. She has no wheezes.  Abdominal: Soft. Bowel sounds are normal. She exhibits no  distension. There is no tenderness.  Musculoskeletal: Normal range of motion. She exhibits no edema or tenderness.  Neurological: She is alert and oriented to person, place, and time.  Skin: Skin is warm and dry.  Psychiatric: She has a normal mood and affect. Her behavior is normal. Judgment and thought content normal.  Vitals reviewed.   BP 136/92 mmHg  Pulse 70  Temp(Src) 97 F (36.1 C) (Oral)  Ht 5' 3.5" (1.613 m)  Wt 170 lb 9.6 oz (77.384 kg)  BMI 29.74 kg/m2       Assessment & Plan:  1. Essential hypertension -Exercise encouraged - Stress Management  -Continue current meds -RTO in 6 months - BMP8+EGFR  2. Overweight (BMI 25.0-29.9) - BMP8+EGFR  3. Hyperlipidemia PT's zocor stopped and LIpitor Prescription sent to pharmacy  - BMP8+EGFR - atorvastatin (LIPITOR) 20 MG tablet; Take 1 tablet (20 mg total) by mouth daily.  Dispense: 90 tablet; Refill: 3   Continue all meds Labs pending Health Maintenance reviewed Diet and exercise encouraged RTO 6 months  Evelina Dun, FNP

## 2015-12-09 NOTE — Patient Instructions (Signed)
DASH Eating Plan °DASH stands for "Dietary Approaches to Stop Hypertension." The DASH eating plan is a healthy eating plan that has been shown to reduce high blood pressure (hypertension). Additional health benefits may include reducing the risk of type 2 diabetes mellitus, heart disease, and stroke. The DASH eating plan may also help with weight loss. °WHAT DO I NEED TO KNOW ABOUT THE DASH EATING PLAN? °For the DASH eating plan, you will follow these general guidelines: °· Choose foods with a percent daily value for sodium of less than 5% (as listed on the food label). °· Use salt-free seasonings or herbs instead of table salt or sea salt. °· Check with your health care provider or pharmacist before using salt substitutes. °· Eat lower-sodium products, often labeled as "lower sodium" or "no salt added." °· Eat fresh foods. °· Eat more vegetables, fruits, and low-fat dairy products. °· Choose whole grains. Look for the word "whole" as the first word in the ingredient list. °· Choose fish and skinless chicken or turkey more often than red meat. Limit fish, poultry, and meat to 6 oz (170 g) each day. °· Limit sweets, desserts, sugars, and sugary drinks. °· Choose heart-healthy fats. °· Limit cheese to 1 oz (28 g) per day. °· Eat more home-cooked food and less restaurant, buffet, and fast food. °· Limit fried foods. °· Cook foods using methods other than frying. °· Limit canned vegetables. If you do use them, rinse them well to decrease the sodium. °· When eating at a restaurant, ask that your food be prepared with less salt, or no salt if possible. °WHAT FOODS CAN I EAT? °Seek help from a dietitian for individual calorie needs. °Grains °Whole grain or whole wheat bread. Brown rice. Whole grain or whole wheat pasta. Quinoa, bulgur, and whole grain cereals. Low-sodium cereals. Corn or whole wheat flour tortillas. Whole grain cornbread. Whole grain crackers. Low-sodium crackers. °Vegetables °Fresh or frozen vegetables  (raw, steamed, roasted, or grilled). Low-sodium or reduced-sodium tomato and vegetable juices. Low-sodium or reduced-sodium tomato sauce and paste. Low-sodium or reduced-sodium canned vegetables.  °Fruits °All fresh, canned (in natural juice), or frozen fruits. °Meat and Other Protein Products °Ground beef (85% or leaner), grass-fed beef, or beef trimmed of fat. Skinless chicken or turkey. Ground chicken or turkey. Pork trimmed of fat. All fish and seafood. Eggs. Dried beans, peas, or lentils. Unsalted nuts and seeds. Unsalted canned beans. °Dairy °Low-fat dairy products, such as skim or 1% milk, 2% or reduced-fat cheeses, low-fat ricotta or cottage cheese, or plain low-fat yogurt. Low-sodium or reduced-sodium cheeses. °Fats and Oils °Tub margarines without trans fats. Light or reduced-fat mayonnaise and salad dressings (reduced sodium). Avocado. Safflower, olive, or canola oils. Natural peanut or almond butter. °Other °Unsalted popcorn and pretzels. °The items listed above may not be a complete list of recommended foods or beverages. Contact your dietitian for more options. °WHAT FOODS ARE NOT RECOMMENDED? °Grains °White bread. White pasta. White rice. Refined cornbread. Bagels and croissants. Crackers that contain trans fat. °Vegetables °Creamed or fried vegetables. Vegetables in a cheese sauce. Regular canned vegetables. Regular canned tomato sauce and paste. Regular tomato and vegetable juices. °Fruits °Dried fruits. Canned fruit in light or heavy syrup. Fruit juice. °Meat and Other Protein Products °Fatty cuts of meat. Ribs, chicken wings, bacon, sausage, bologna, salami, chitterlings, fatback, hot dogs, bratwurst, and packaged luncheon meats. Salted nuts and seeds. Canned beans with salt. °Dairy °Whole or 2% milk, cream, half-and-half, and cream cheese. Whole-fat or sweetened yogurt. Full-fat   cheeses or blue cheese. Nondairy creamers and whipped toppings. Processed cheese, cheese spreads, or cheese  curds. °Condiments °Onion and garlic salt, seasoned salt, table salt, and sea salt. Canned and packaged gravies. Worcestershire sauce. Tartar sauce. Barbecue sauce. Teriyaki sauce. Soy sauce, including reduced sodium. Steak sauce. Fish sauce. Oyster sauce. Cocktail sauce. Horseradish. Ketchup and mustard. Meat flavorings and tenderizers. Bouillon cubes. Hot sauce. Tabasco sauce. Marinades. Taco seasonings. Relishes. °Fats and Oils °Butter, stick margarine, lard, shortening, ghee, and bacon fat. Coconut, palm kernel, or palm oils. Regular salad dressings. °Other °Pickles and olives. Salted popcorn and pretzels. °The items listed above may not be a complete list of foods and beverages to avoid. Contact your dietitian for more information. °WHERE CAN I FIND MORE INFORMATION? °National [employer], [employer], [employer] Institute: www.nhlbi.nih.gov/health/health-topics/topics/dash/ °  °This information is not intended to replace advice given to you by your health care provider. Make sure you discuss any questions you have with your health care provider. °  °Document Released: 06/25/2011 Document Revised: 07/27/2014 Document Reviewed: 05/10/2013 °Elsevier Interactive Patient Education ©2016 [employer] Inc. ° °Hypertension °Hypertension, commonly called high blood pressure, is when the force of blood pumping through your arteries is too strong. Your arteries are the blood vessels that carry blood from your heart throughout your body. A blood pressure reading consists of a higher number over a lower number, such as 110/72. The higher number (systolic) is the pressure inside your arteries when your heart pumps. The lower number (diastolic) is the pressure inside your arteries when your heart relaxes. Ideally you want your blood pressure below 120/80. °Hypertension forces your heart to work harder to pump blood. Your arteries may become narrow or stiff. Having untreated or uncontrolled hypertension can cause heart attack, stroke, kidney  disease, and other problems. °RISK FACTORS °Some risk factors for high blood pressure are controllable. Others are not.  °Risk factors you cannot control include:  °· Race. You may be at higher risk if you are African American. °· Age. Risk increases with age. °· Gender. Men are at higher risk than women before age 45 years. After age 65, women are at higher risk than men. °Risk factors you can control include: °· Not getting enough exercise or physical activity. °· Being overweight. °· Getting too much fat, sugar, calories, or salt in your diet. °· Drinking too much alcohol. °SIGNS AND SYMPTOMS °Hypertension does not usually cause signs or symptoms. Extremely high blood pressure (hypertensive crisis) may cause headache, anxiety, shortness of breath, and nosebleed. °DIAGNOSIS °To check if you have hypertension, your health care provider will measure your blood pressure while you are seated, with your arm held at the level of your heart. It should be measured at least twice using the same arm. Certain conditions can cause a difference in blood pressure between your right and left arms. A blood pressure reading that is higher than normal on one occasion does not mean that you need treatment. If it is not clear whether you have high blood pressure, you may be asked to return on a different day to have your blood pressure checked again. Or, you may be asked to monitor your blood pressure at home for 1 or more weeks. °TREATMENT °Treating high blood pressure includes making lifestyle changes and possibly taking medicine. Living a healthy lifestyle can help lower high blood pressure. You may need to change some of your habits. °Lifestyle changes may include: °· Following the DASH diet. This diet is high in fruits, vegetables, and whole   grains. It is low in salt, red meat, and added sugars. °· Keep your sodium intake below 2,300 mg per day. °· Getting at least 30-45 minutes of aerobic exercise at least 4 times per  week. °· Losing weight if necessary. °· Not smoking. °· Limiting alcoholic beverages. °· Learning ways to reduce stress. °Your health care provider may prescribe medicine if lifestyle changes are not enough to get your blood pressure under control, and if one of the following is true: °· You are 18-59 years of age and your systolic blood pressure is above 140. °· You are 60 years of age or older, and your systolic blood pressure is above 150. °· Your diastolic blood pressure is above 90. °· You have diabetes, and your systolic blood pressure is over 140 or your diastolic blood pressure is over 90. °· You have kidney disease and your blood pressure is above 140/90. °· You have heart disease and your blood pressure is above 140/90. °Your personal target blood pressure may vary depending on your medical conditions, your age, and other factors. °HOME CARE INSTRUCTIONS °· Have your blood pressure rechecked as directed by your health care provider.   °· Take medicines only as directed by your health care provider. Follow the directions carefully. Blood pressure medicines must be taken as prescribed. The medicine does not work as well when you skip doses. Skipping doses also puts you at risk for problems. °· Do not smoke.   °· Monitor your blood pressure at home as directed by your health care provider.  °SEEK MEDICAL CARE IF:  °· You think you are having a reaction to medicines taken. °· You have recurrent headaches or feel dizzy. °· You have swelling in your ankles. °· You have trouble with your vision. °SEEK IMMEDIATE MEDICAL CARE IF: °· You develop a severe headache or confusion. °· You have unusual weakness, numbness, or feel faint. °· You have severe chest or abdominal pain. °· You vomit repeatedly. °· You have trouble breathing. °MAKE SURE YOU:  °· Understand these instructions. °· Will watch your condition. °· Will get help right away if you are not doing well or get worse. °  °This information is not intended to  replace advice given to you by your health care provider. Make sure you discuss any questions you have with your health care provider. °  °Document Released: 07/06/2005 Document Revised: 11/20/2014 Document Reviewed: 04/28/2013 °Elsevier Interactive Patient Education ©2016 Elsevier Inc. ° °

## 2016-03-24 ENCOUNTER — Other Ambulatory Visit: Payer: Self-pay | Admitting: Nurse Practitioner

## 2016-03-24 DIAGNOSIS — Z1231 Encounter for screening mammogram for malignant neoplasm of breast: Secondary | ICD-10-CM

## 2016-04-06 ENCOUNTER — Ambulatory Visit (INDEPENDENT_AMBULATORY_CARE_PROVIDER_SITE_OTHER): Payer: BLUE CROSS/BLUE SHIELD | Admitting: Nurse Practitioner

## 2016-04-06 ENCOUNTER — Encounter: Payer: Self-pay | Admitting: Nurse Practitioner

## 2016-04-06 VITALS — BP 136/84 | HR 72 | Temp 97.0°F | Ht 63.0 in | Wt 175.0 lb

## 2016-04-06 DIAGNOSIS — N6320 Unspecified lump in the left breast, unspecified quadrant: Secondary | ICD-10-CM

## 2016-04-06 DIAGNOSIS — N63 Unspecified lump in breast: Secondary | ICD-10-CM | POA: Diagnosis not present

## 2016-04-06 NOTE — Progress Notes (Signed)
   Subjective:    Patient ID: Anita Moreno, female    DOB: 1965-08-04, 50 y.o.   MRN: [id number]  HPI Patient comes in c/o lump in left breast. Found it 2 weeks ago- no change in size since found it. nontender.    Review of Systems  Constitutional: Negative for fever.  HENT: Negative for congestion, ear pain, sinus pressure and trouble swallowing.   Respiratory: Negative.   Cardiovascular: Negative.   Gastrointestinal: Negative.   Genitourinary: Negative.   Neurological: Negative.   Psychiatric/Behavioral: Negative.   All other systems reviewed and are negative.      Objective:   Physical Exam  Constitutional: She is oriented to person, place, and time. She appears well-developed and well-nourished. No distress.  Cardiovascular: Normal rate, regular rhythm and normal heart sounds.   Pulmonary/Chest: Effort normal and breath sounds normal. Right breast exhibits no inverted nipple, no mass, no nipple discharge, no skin change and no tenderness. Left breast exhibits mass. Left breast exhibits no inverted nipple, no nipple discharge, no skin change and no tenderness. Breasts are symmetrical.    Neurological: She is alert and oriented to person, place, and time.  Skin: Skin is warm.  Psychiatric: She has a normal mood and affect. Her behavior is normal. Judgment and thought content normal.    BP 136/84 (BP Location: Left Arm, Cuff Size: Normal)   Pulse 72   Temp 97 F (36.1 C) (Oral)   Ht 5\' 3"  (1.6 m)   Wt 175 lb (79.4 kg)   BMI 31.00 kg/m        Assessment & Plan:  1. Mass of breast, left Orders Placed This Encounter  Procedures  . MM Digital Diagnostic Unilat L    Standing Status:   Future    Standing Expiration Date:   06/06/2017    Order Specific Question:   Reason for Exam (SYMPTOM  OR DIAGNOSIS REQUIRED)    Answer:   left breast mass    Order Specific Question:   Is the patient pregnant?    Answer:   No    Order Specific Question:   Preferred imaging  location?    Answer:   [facility]   Continue watch mass until [facility], [place]

## 2016-04-09 ENCOUNTER — Ambulatory Visit: Payer: BLUE CROSS/BLUE SHIELD | Admitting: Neurology

## 2016-04-16 ENCOUNTER — Other Ambulatory Visit: Payer: Self-pay | Admitting: Nurse Practitioner

## 2016-04-16 DIAGNOSIS — N6320 Unspecified lump in the left breast, unspecified quadrant: Secondary | ICD-10-CM

## 2016-04-17 ENCOUNTER — Other Ambulatory Visit: Payer: BLUE CROSS/BLUE SHIELD

## 2016-04-20 ENCOUNTER — Ambulatory Visit
Admission: RE | Admit: 2016-04-20 | Discharge: 2016-04-20 | Disposition: A | Discharge status: Home / Self Care | Payer: BLUE CROSS/BLUE SHIELD | Source: Ambulatory Visit | Attending: Nurse Practitioner | Admitting: Nurse Practitioner

## 2016-04-20 ENCOUNTER — Other Ambulatory Visit: Payer: Self-pay | Admitting: Nurse Practitioner

## 2016-04-20 DIAGNOSIS — N6320 Unspecified lump in the left breast, unspecified quadrant: Secondary | ICD-10-CM

## 2016-04-22 ENCOUNTER — Ambulatory Visit
Admission: RE | Admit: 2016-04-22 | Discharge: 2016-04-22 | Disposition: A | Discharge status: Home / Self Care | Payer: BLUE CROSS/BLUE SHIELD | Source: Ambulatory Visit | Attending: Nurse Practitioner | Admitting: Nurse Practitioner

## 2016-04-22 DIAGNOSIS — N6320 Unspecified lump in the left breast, unspecified quadrant: Secondary | ICD-10-CM

## 2016-04-27 ENCOUNTER — Other Ambulatory Visit: Payer: Self-pay | Admitting: Surgery

## 2016-04-27 DIAGNOSIS — N6320 Unspecified lump in the left breast, unspecified quadrant: Secondary | ICD-10-CM

## 2016-05-04 ENCOUNTER — Other Ambulatory Visit: Payer: Self-pay | Admitting: Surgery

## 2016-05-05 ENCOUNTER — Other Ambulatory Visit: Payer: Self-pay | Admitting: Surgery

## 2016-05-05 DIAGNOSIS — N6320 Unspecified lump in the left breast, unspecified quadrant: Secondary | ICD-10-CM

## 2016-05-07 ENCOUNTER — Encounter (HOSPITAL_BASED_OUTPATIENT_CLINIC_OR_DEPARTMENT_OTHER): Payer: Self-pay | Admitting: *Deleted

## 2016-05-07 NOTE — Progress Notes (Signed)
Pt is going to [facility] for [employer] , given number to call and set up appointment. Pt instructed to drink Boost by 0630 day of surgery, pt plans to buy her own boost to drink rather coming to [place] to get.

## 2016-05-08 ENCOUNTER — Encounter (HOSPITAL_COMMUNITY)
Admission: RE | Admit: 2016-05-08 | Discharge: 2016-05-08 | Disposition: A | Discharge status: Home / Self Care | Payer: BLUE CROSS/BLUE SHIELD | Source: Ambulatory Visit | Attending: Surgery | Admitting: Surgery

## 2016-05-08 DIAGNOSIS — Z01812 Encounter for preprocedural laboratory examination: Secondary | ICD-10-CM | POA: Insufficient documentation

## 2016-05-08 DIAGNOSIS — Z01810 Encounter for preprocedural cardiovascular examination: Secondary | ICD-10-CM | POA: Diagnosis not present

## 2016-05-08 DIAGNOSIS — I10 Essential (primary) hypertension: Secondary | ICD-10-CM | POA: Diagnosis not present

## 2016-05-08 LAB — BASIC METABOLIC PANEL
Anion gap: 8 (ref 5–15)
BUN: 15 mg/dL (ref 6–20)
CALCIUM: 8.9 mg/dL (ref 8.9–10.3)
CO2: 27 mmol/L (ref 22–32)
CREATININE: 0.69 mg/dL (ref 0.44–1.00)
Chloride: 103 mmol/L (ref 101–111)
GFR calc non Af Amer: >60 mL/min (ref >60)
Glucose, Bld: 114 mg/dL — ABNORMAL HIGH (ref 65–99)
Potassium: 3.5 mmol/L (ref 3.5–5.1)
SODIUM: 138 mmol/L (ref 135–145)

## 2016-05-12 ENCOUNTER — Ambulatory Visit
Admission: RE | Admit: 2016-05-12 | Discharge: 2016-05-12 | Disposition: A | Discharge status: Home / Self Care | Payer: BLUE CROSS/BLUE SHIELD | Source: Ambulatory Visit | Attending: Surgery | Admitting: Surgery

## 2016-05-12 ENCOUNTER — Other Ambulatory Visit: Payer: Self-pay | Admitting: Surgery

## 2016-05-12 DIAGNOSIS — N6320 Unspecified lump in the left breast, unspecified quadrant: Secondary | ICD-10-CM

## 2016-05-12 NOTE — H&P (Addendum)
Anita Moreno 04/27/2016 2:08 PM Location: [facility] Patient #: [id number] DOB: 24-Apr-1966 Single / Language: Anita Moreno / Race: White Female   History of Present Illness ([name] A. Ninfa Linden MD; 04/27/2016 2:30 PM) The patient is a 50 year old female who presents with a breast mass. This is his here today for evaluation of a left breast mass. 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 She has a significant history of 2 aunts having had breast cancer. Her biopsy on this most recent mass was again consistent with a fibroadenoma but it is felt to be discordant with the images by the radiologist. She is otherwise healthy with no complaints. She denies nipple discharge.   Other Problems Elbert Ewings, CMA; 04/27/2016 2:08 PM) Back Pain High blood pressure Hypercholesterolemia  Past Surgical History Elbert Ewings, CMA; 04/27/2016 2:08 PM) Breast Biopsy Left. Breast Mass; Local Excision Left. Knee Surgery Bilateral. Oral Surgery Spinal Surgery - Lower Back  Diagnostic Studies History Elbert Ewings, [place]; 04/27/2016 2:08 PM) Colonoscopy never Mammogram 1-3 years ago Pap Smear never  Allergies Elbert Ewings, CMA; 04/27/2016 2:09 PM) No Known Drug Allergies10/03/2016  Medication History Elbert Ewings, CMA; 04/27/2016 2:09 PM) Atorvastatin Calcium (20MG  Tablet, Oral) Active. CeleXA (20MG  Tablet, Oral) Active. HydroCHLOROthiazide (25MG  Tablet, Oral) Active. Ibuprofen (200MG  Capsule, Oral) Active. Medications Reconciled  Social History Elbert Ewings, [place]; 04/27/2016 2:08 PM) Alcohol use Occasional alcohol use. Caffeine use Carbonated beverages, Coffee, Tea. No drug use Tobacco use Never smoker.  Family History Elbert Ewings, [place]; 04/27/2016 2:08 PM) Alcohol Abuse Brother, Father. Heart Disease Brother. Heart disease in female family member before age 63 Hypertension Brother, Father, Mother,  Sister.  Pregnancy / Birth History Elbert Ewings, [place]; 04/27/2016 2:08 PM) Age at menarche 51 years. Gravida 0 Para 0 Regular periods    Review of Systems Elbert Ewings CMA; 04/27/2016 2:08 PM) General Not Present- Appetite Loss, Chills, Fatigue, Fever, Night Sweats, Weight Gain and Weight Loss. Skin Not Present- Change in Wart/Mole, Dryness, Hives, Jaundice, New Lesions, Non-Healing Wounds, Rash and Ulcer. HEENT Not Present- Earache, Hearing Loss, Hoarseness, Nose Bleed, Oral Ulcers, Ringing in the Ears, Seasonal Allergies, Sinus Pain, Sore Throat, Visual Disturbances, Wears glasses/contact lenses and Yellow Eyes. Respiratory Not Present- Bloody sputum, Chronic Cough, Difficulty Breathing, Snoring and Wheezing. Breast Present- Breast Mass. Not Present- Breast Pain, Nipple Discharge and Skin Changes. Cardiovascular Not Present- Chest Pain, Difficulty Breathing Lying Down, Leg Cramps, Palpitations, Rapid Heart Rate, Shortness of Breath and Swelling of Extremities. Gastrointestinal Not Present- Abdominal Pain, Bloating, Bloody Stool, Change in Bowel Habits, Chronic diarrhea, Constipation, Difficulty Swallowing, Excessive gas, Gets full quickly at meals, Hemorrhoids, Indigestion, Nausea, Rectal Pain and Vomiting. Female Genitourinary Not Present- Frequency, Nocturia, Painful Urination, Pelvic Pain and Urgency. Musculoskeletal Not Present- Back Pain, Joint Pain, Joint Stiffness, Muscle Pain, Muscle Weakness and Swelling of Extremities. Neurological Not Present- Decreased Memory, Fainting, Headaches, Numbness, Seizures, Tingling, Tremor, Trouble walking and Weakness. Psychiatric Not Present- Anxiety, Bipolar, Change in Sleep Pattern, Depression, Fearful and Frequent crying. Endocrine Not Present- Cold Intolerance, Excessive Hunger, Hair Changes, Heat Intolerance, Hot flashes and New Diabetes. Hematology Not Present- Blood Thinners, Easy Bruising, Excessive bleeding, Gland problems, HIV and  Persistent Infections.  Vitals Elbert Ewings CMA; 04/27/2016 2:10 PM) 04/27/2016 2:09 PM Weight: 174 lb Height: 62in Body Surface Area: 1.8 m Body Mass Index: 31.82 kg/m  Temp.: 98.70F(Temporal)  Pulse: 88 (Regular)  BP: 126/76 (Sitting, Left Arm, Standard)  Physical Exam (Dana Dorner A. Ninfa Linden MD; 04/27/2016 2:31 PM) The physical exam findings are as follows: Note:On examination, there is no left breast axillary adenopathy. There is some very mild ecchymosis at the 12 position of the left breast and some fullness but I cannot fully palpate the mass secondary to the postbiopsy changes. The nipple and areola are normal Lungs clear CV RRR Abdomen soft, NT Skin without rashes Generally well in appearance   Assessment & Plan (Terriona Horlacher A. Ninfa Linden MD; 04/27/2016 2:31 PM) LEFT BREAST MASS (N63.20) Impression: I reviewed the mammogram and ultrasound reports as well as the pathology. Given her family history and discordant nature of the films, excision of the left breast mass is recommended. As I cannot feel it well decision was made to proceed with radioactive seed localized left breast lumpectomy. I discussed this with her in detail including the risks. She understands and wishes to proceed with surgery

## 2016-05-13 ENCOUNTER — Ambulatory Visit (HOSPITAL_BASED_OUTPATIENT_CLINIC_OR_DEPARTMENT_OTHER): Payer: BLUE CROSS/BLUE SHIELD | Admitting: Certified Registered"

## 2016-05-13 ENCOUNTER — Encounter (HOSPITAL_BASED_OUTPATIENT_CLINIC_OR_DEPARTMENT_OTHER)
Admission: RE | Disposition: A | Discharge status: Home / Self Care | Payer: Self-pay | Source: Ambulatory Visit | Attending: Surgery

## 2016-05-13 ENCOUNTER — Ambulatory Visit (HOSPITAL_BASED_OUTPATIENT_CLINIC_OR_DEPARTMENT_OTHER)
Admission: RE | Admit: 2016-05-13 | Discharge: 2016-05-13 | Disposition: A | Discharge status: Home / Self Care | Payer: BLUE CROSS/BLUE SHIELD | Source: Ambulatory Visit | Attending: Surgery | Admitting: Surgery

## 2016-05-13 ENCOUNTER — Encounter (HOSPITAL_BASED_OUTPATIENT_CLINIC_OR_DEPARTMENT_OTHER): Payer: Self-pay | Admitting: Certified Registered"

## 2016-05-13 ENCOUNTER — Ambulatory Visit
Admission: RE | Admit: 2016-05-13 | Discharge: 2016-05-13 | Disposition: A | Discharge status: Home / Self Care | Payer: BLUE CROSS/BLUE SHIELD | Source: Ambulatory Visit | Attending: Surgery | Admitting: Surgery

## 2016-05-13 DIAGNOSIS — N62 Hypertrophy of breast: Secondary | ICD-10-CM | POA: Insufficient documentation

## 2016-05-13 DIAGNOSIS — I10 Essential (primary) hypertension: Secondary | ICD-10-CM | POA: Insufficient documentation

## 2016-05-13 DIAGNOSIS — Z79899 Other long term (current) drug therapy: Secondary | ICD-10-CM | POA: Insufficient documentation

## 2016-05-13 DIAGNOSIS — N6320 Unspecified lump in the left breast, unspecified quadrant: Secondary | ICD-10-CM

## 2016-05-13 DIAGNOSIS — E7800 Pure hypercholesterolemia, unspecified: Secondary | ICD-10-CM | POA: Insufficient documentation

## 2016-05-13 DIAGNOSIS — N6022 Fibroadenosis of left breast: Secondary | ICD-10-CM | POA: Insufficient documentation

## 2016-05-13 DIAGNOSIS — D242 Benign neoplasm of left breast: Secondary | ICD-10-CM | POA: Insufficient documentation

## 2016-05-13 HISTORY — PX: BREAST LUMPECTOMY WITH RADIOACTIVE SEED LOCALIZATION: SHX6424

## 2016-05-13 SURGERY — BREAST LUMPECTOMY WITH RADIOACTIVE SEED LOCALIZATION
Anesthesia: General | Site: Breast | Laterality: Left

## 2016-05-13 MED ORDER — CHLORHEXIDINE GLUCONATE CLOTH 2 % EX PADS: 6.0000 | MEDICATED_PAD | Freq: Once | CUTANEOUS | 0 refills | Status: AC

## 2016-05-13 MED ORDER — PROPOFOL 10 MG/ML IV BOLUS
INTRAVENOUS | Status: DC | PRN
Start: 2016-05-13 — End: 2016-05-13
  Administered 2016-05-13: 180 mg via INTRAVENOUS

## 2016-05-13 MED ORDER — FENTANYL CITRATE (PF) 100 MCG/2ML IJ SOLN
25.0000 ug | INTRAMUSCULAR | Status: DC | PRN
  Administered 2016-05-13 (×2): 50 ug via INTRAVENOUS

## 2016-05-13 MED ORDER — TRAMADOL HCL 50 MG PO TABS: 50.0000 mg | ORAL_TABLET | Freq: Four times a day (QID) | ORAL | 1 refills | Status: DC | PRN

## 2016-05-13 MED ORDER — CEFAZOLIN SODIUM-DEXTROSE 2-4 GM/100ML-% IV SOLN
2.0000 g | INTRAVENOUS | Status: AC
Start: 2016-05-13 — End: 2016-05-13
  Administered 2016-05-13: 2 g via INTRAVENOUS

## 2016-05-13 MED ORDER — FENTANYL CITRATE (PF) 100 MCG/2ML IJ SOLN
INTRAMUSCULAR | Status: AC
  Filled 2016-05-13: qty 2

## 2016-05-13 MED ORDER — ONDANSETRON HCL 4 MG/2ML IJ SOLN
INTRAMUSCULAR | Status: DC | PRN
  Administered 2016-05-13: 4 mg via INTRAVENOUS

## 2016-05-13 MED ORDER — METOCLOPRAMIDE HCL 5 MG/ML IJ SOLN: 10.0000 mg | Freq: Once | INTRAMUSCULAR | Status: DC | PRN

## 2016-05-13 MED ORDER — FENTANYL CITRATE (PF) 100 MCG/2ML IJ SOLN
50.0000 ug | INTRAMUSCULAR | Status: DC | PRN
  Administered 2016-05-13: 100 ug via INTRAVENOUS

## 2016-05-13 MED ORDER — DEXAMETHASONE SODIUM PHOSPHATE 10 MG/ML IJ SOLN
INTRAMUSCULAR | Status: AC
  Filled 2016-05-13: qty 1

## 2016-05-13 MED ORDER — BUPIVACAINE-EPINEPHRINE 0.25% -1:200000 IJ SOLN
INTRAMUSCULAR | Status: DC | PRN
  Administered 2016-05-13: 15 mL

## 2016-05-13 MED ORDER — MIDAZOLAM HCL 2 MG/2ML IJ SOLN
INTRAMUSCULAR | Status: AC
  Filled 2016-05-13: qty 2

## 2016-05-13 MED ORDER — GLYCOPYRROLATE 0.2 MG/ML IJ SOLN: 0.2000 mg | Freq: Once | INTRAMUSCULAR | Status: DC | PRN

## 2016-05-13 MED ORDER — LIDOCAINE 2% (20 MG/ML) 5 ML SYRINGE
INTRAMUSCULAR | Status: DC | PRN
  Administered 2016-05-13: 100 mg via INTRAVENOUS

## 2016-05-13 MED ORDER — LACTATED RINGERS IV SOLN
INTRAVENOUS | Status: DC
Start: 2016-05-13 — End: 2016-05-13

## 2016-05-13 MED ORDER — DEXAMETHASONE SODIUM PHOSPHATE 4 MG/ML IJ SOLN
INTRAMUSCULAR | Status: DC | PRN
  Administered 2016-05-13: 10 mg via INTRAVENOUS

## 2016-05-13 MED ORDER — MEPERIDINE HCL 25 MG/ML IJ SOLN: 6.2500 mg | INTRAMUSCULAR | Status: DC | PRN

## 2016-05-13 MED ORDER — CEFAZOLIN SODIUM-DEXTROSE 2-4 GM/100ML-% IV SOLN
INTRAVENOUS | Status: AC
  Filled 2016-05-13: qty 100

## 2016-05-13 MED ORDER — ONDANSETRON HCL 4 MG/2ML IJ SOLN
INTRAMUSCULAR | Status: AC
  Filled 2016-05-13: qty 2

## 2016-05-13 MED ORDER — MIDAZOLAM HCL 2 MG/2ML IJ SOLN
1.0000 mg | INTRAMUSCULAR | Status: DC | PRN
  Administered 2016-05-13: 2 mg via INTRAVENOUS

## 2016-05-13 MED ORDER — LIDOCAINE 2% (20 MG/ML) 5 ML SYRINGE
INTRAMUSCULAR | Status: AC
  Filled 2016-05-13: qty 5

## 2016-05-13 MED ORDER — CHLORHEXIDINE GLUCONATE CLOTH 2 % EX PADS
6.0000 | MEDICATED_PAD | Freq: Once | CUTANEOUS | Status: DC
Start: 2016-05-13 — End: 2016-05-13

## 2016-05-13 MED ORDER — LACTATED RINGERS IV SOLN
INTRAVENOUS | Status: DC
  Administered 2016-05-13 (×2): via INTRAVENOUS

## 2016-05-13 MED ORDER — SCOPOLAMINE 1 MG/3DAYS TD PT72: 1.0000 | MEDICATED_PATCH | Freq: Once | TRANSDERMAL | Status: DC | PRN

## 2016-05-13 SURGICAL SUPPLY — 44 items
ADH SKN CLS APL DERMABOND .7 (GAUZE/BANDAGES/DRESSINGS) ×1
APPLIER CLIP 9.375 MED OPEN (MISCELLANEOUS)
APR CLP MED 9.3 20 MLT OPN (MISCELLANEOUS)
BLADE HEX COATED 2.75 (ELECTRODE) ×2 IMPLANT
BLADE SURG 15 STRL LF DISP TIS (BLADE) ×1 IMPLANT
BLADE SURG 15 STRL SS (BLADE) ×2
CANISTER SUC SOCK COL 7IN (MISCELLANEOUS) IMPLANT
CANISTER SUCT 1200ML W/VALVE (MISCELLANEOUS) IMPLANT
CHLORAPREP W/TINT 26ML (MISCELLANEOUS) ×2 IMPLANT
CLIP APPLIE 9.375 MED OPEN (MISCELLANEOUS) IMPLANT
CLIP TI WIDE RED SMALL 6 (CLIP) IMPLANT
COVER BACK TABLE 60X90IN (DRAPES) ×2 IMPLANT
COVER MAYO STAND STRL (DRAPES) ×2 IMPLANT
COVER PROBE W GEL 5X96 (DRAPES) ×2 IMPLANT
DECANTER SPIKE VIAL GLASS SM (MISCELLANEOUS) IMPLANT
DERMABOND ADVANCED (GAUZE/BANDAGES/DRESSINGS) ×1
DERMABOND ADVANCED .7 DNX12 (GAUZE/BANDAGES/DRESSINGS) ×1 IMPLANT
DEVICE DUBIN W/COMP PLATE 8390 (MISCELLANEOUS) ×2 IMPLANT
DRAPE LAPAROSCOPIC ABDOMINAL (DRAPES) ×2 IMPLANT
DRAPE UTILITY XL STRL (DRAPES) ×2 IMPLANT
ELECT REM PT RETURN 9FT ADLT (ELECTROSURGICAL) ×2
ELECTRODE REM PT RTRN 9FT ADLT (ELECTROSURGICAL) ×1 IMPLANT
GLOVE SURG SIGNA 7.5 PF LTX (GLOVE) ×2 IMPLANT
GOWN STRL REUS W/ TWL LRG LVL3 (GOWN DISPOSABLE) ×1 IMPLANT
GOWN STRL REUS W/ TWL XL LVL3 (GOWN DISPOSABLE) ×1 IMPLANT
GOWN STRL REUS W/TWL LRG LVL3 (GOWN DISPOSABLE) ×2
GOWN STRL REUS W/TWL XL LVL3 (GOWN DISPOSABLE) ×2
KIT MARKER MARGIN INK (KITS) ×2 IMPLANT
NEEDLE HYPO 25X1 1.5 SAFETY (NEEDLE) ×2 IMPLANT
NS IRRIG 1000ML POUR BTL (IV SOLUTION) IMPLANT
PACK BASIN DAY SURGERY FS (CUSTOM PROCEDURE TRAY) ×2 IMPLANT
PENCIL BUTTON HOLSTER BLD 10FT (ELECTRODE) ×2 IMPLANT
SLEEVE SCD COMPRESS KNEE MED (MISCELLANEOUS) ×2 IMPLANT
SPONGE GAUZE 4X4 12PLY STER LF (GAUZE/BANDAGES/DRESSINGS) IMPLANT
SPONGE LAP 4X18 X RAY DECT (DISPOSABLE) ×2 IMPLANT
SUT MNCRL AB 4-0 PS2 18 (SUTURE) ×2 IMPLANT
SUT SILK 2 0 SH (SUTURE) IMPLANT
SUT VIC AB 3-0 SH 27 (SUTURE) ×2
SUT VIC AB 3-0 SH 27X BRD (SUTURE) ×1 IMPLANT
SYR CONTROL 10ML LL (SYRINGE) ×2 IMPLANT
TOWEL OR 17X24 6PK STRL BLUE (TOWEL DISPOSABLE) ×2 IMPLANT
TOWEL OR NON WOVEN STRL DISP B (DISPOSABLE) IMPLANT
TUBE CONNECTING 20X1/4 (TUBING) IMPLANT
YANKAUER SUCT BULB TIP NO VENT (SUCTIONS) IMPLANT

## 2016-05-13 NOTE — Anesthesia Procedure Notes (Signed)
Procedure Name: LMA Insertion Date/Time: 05/13/2016 9:58 AM Performed by: Baxter Flattery Pre-anesthesia Checklist: Patient identified, Emergency Drugs available, Suction available and Patient being monitored Patient Re-evaluated:Patient Re-evaluated prior to inductionOxygen Delivery Method: Circle system utilized Preoxygenation: Pre-oxygenation with 100% oxygen Intubation Type: IV induction Ventilation: Mask ventilation without difficulty LMA: LMA inserted LMA Size: 4.0 Number of attempts: 1 Airway Equipment and Method: Bite block Placement Confirmation: positive ETCO2 and breath sounds checked- equal and bilateral Tube secured with: Tape Dental Injury: Teeth and Oropharynx as per pre-operative assessment

## 2016-05-13 NOTE — Anesthesia Postprocedure Evaluation (Signed)
Anesthesia Post Note  Patient: Anita Moreno  Procedure(s) Performed: Procedure(s) (LRB): LEFT BREAST LUMPECTOMY WITH RADIOACTIVE SEED LOCALIZATION (Left)  Patient location during evaluation: PACU Anesthesia Type: General Level of consciousness: awake and alert Pain management: pain level controlled Vital Signs Assessment: post-procedure vital signs reviewed and stable Respiratory status: spontaneous breathing, nonlabored ventilation, respiratory function stable and patient connected to nasal cannula oxygen Cardiovascular status: blood pressure returned to baseline and stable Postop Assessment: no signs of nausea or vomiting Anesthetic complications: no    Last Vitals:  Vitals:   05/13/16 1039 05/13/16 1125  BP:  138/82  Pulse: 70 68  Resp: 18 18  Temp:  36.4 C    Last Pain:  Vitals:   05/13/16 1125  TempSrc:   PainSc: 2                  Montez Hageman

## 2016-05-13 NOTE — Op Note (Signed)
LEFT BREAST LUMPECTOMY WITH RADIOACTIVE SEED LOCALIZATION  Procedure Note  Anita Moreno 05/13/2016   Pre-op Diagnosis: LEFT BREAST MASS     Post-op Diagnosis: Same  Procedure(s): LEFT BREAST LUMPECTOMY WITH RADIOACTIVE SEED LOCALIZATION  Surgeon(s): Coralie Keens, MD  Anesthesia: General  Staff:  Circulator: Gaetano Net, RN Scrub Person: Silvio Clayman, CST  Estimated Blood Loss: Minimal               Specimens: Sent to path  Indications: This is a 50 year old female with a previous history of fibroadenoma the breast. She had a suspicious mass in the left breast. Biopsy was performed showing this to be consistent with a fibroadenoma however it was felt to be discordant with the mammogram therefore excision of the mass is been recommended.  Procedure: The patient already had the radioactive seed inserted in the left breast by radiology. She was taken from the holding area to the operating room. She was placed supine on the operating table and general anesthesia was induced. Her left breast was then prepped and draped in usual sterile fashion. I identified the area of increased uptake at the 11:30 to 12, position of the left breast. I discussed the skin of the upper edge of the areola with Marcaine. I then made a circumareolar incision with a scalpel. I then took this down to the breast tissue with electrocautery. I then tunneled superiorly toward the mass with the aid of the neoprobe. The mass was then easily palpable and I excised it in its entirety with the electrocautery. Once the mass was removed, I marked with marker pain. I confirmed that the radioactive seed was in the specimen with the neoprobe. X-ray was then performed confirming that the seton previous marker were in the specimen. The specimen was then sent to pathology for evaluation. I then achieved hemostasis with cautery. I anesthetized wound further with Marcaine. I placed surgical clips at the area of the  biopsy. I then closed the subcutaneous tissue with interrupted 3-0 Vicryl sutures and closed the skin with a running 4-0 Monocryl. Skin glue was then applied. The patient tolerated procedure well. All the counts were correct at the end of the procedure. The patient was then extubated in the operating room and taken in a stable condition to the recovery room.          Anita Moreno A   Date: 05/13/2016  Time: 10:36 AM

## 2016-05-13 NOTE — Discharge Instructions (Signed)
[place] Office Phone Number [phone]  BREAST BIOPSY/ PARTIAL MASTECTOMY: POST OP INSTRUCTIONS  Always review your discharge instruction sheet given to you by the facility where your surgery was performed.  IF YOU HAVE DISABILITY OR FAMILY LEAVE FORMS, YOU MUST BRING THEM TO THE OFFICE FOR PROCESSING.  DO NOT GIVE THEM TO YOUR DOCTOR.  1. A prescription for pain medication may be given to you upon discharge.  Take your pain medication as prescribed, if needed.  If narcotic pain medicine is not needed, then you may take acetaminophen (Tylenol) or ibuprofen (Advil) as needed. 2. Take your usually prescribed medications unless otherwise directed 3. If you need a refill on your pain medication, please contact your pharmacy.  They will contact our office to request authorization.  Prescriptions will not be filled after 5pm or on week-ends. 4. You should eat very light the first 24 hours after surgery, such as soup, crackers, pudding, etc.  Resume your normal diet the day after surgery. 5. Most patients will experience some swelling and bruising in the breast.  Ice packs and a good support bra will help.  Swelling and bruising can take several days to resolve.  6. It is common to experience some constipation if taking pain medication after surgery.  Increasing fluid intake and taking a stool softener will usually help or prevent this problem from occurring.  A mild laxative (Milk of Magnesia or Miralax) should be taken according to package directions if there are no bowel movements after 48 hours. 7. Unless discharge instructions indicate otherwise, you may remove your bandages 24-48 hours after surgery, and you may shower at that time.  You may have steri-strips (small skin tapes) in place directly over the incision.  These strips should be left on the skin for 7-10 days.  If your surgeon used skin glue on the incision, you may shower in 24 hours.  The glue will flake off over the  next 2-3 weeks.  Any sutures or staples will be removed at the office during your follow-up visit. 8. ACTIVITIES:  You may resume regular daily activities (gradually increasing) beginning the next day.  Wearing a good support bra or sports bra minimizes pain and swelling.  You may have sexual intercourse when it is comfortable. a. You may drive when you no longer are taking prescription pain medication, you can comfortably wear a seatbelt, and you can safely maneuver your car and apply brakes. b. RETURN TO WORK:  ___OK TO RETURN TO WORK ON Tuesday OCT 31ST TO FULL ACTIVITIY WITHOUT LIMITS c. ___________________________________________________________________________________ 9. You should see your doctor in the office for a follow-up appointment approximately two weeks after your surgery.  Your doctors nurse will typically make your follow-up appointment when she calls you with your pathology report.  Expect your pathology report 2-3 business days after your surgery.  You may call to check if you do not hear from [place] after three days. 10. OTHER INSTRUCTIONS: _______________________________________________________________________________________________ _____________________________________________________________________________________________________________________________________ _____________________________________________________________________________________________________________________________________ _____________________________________________________________________________________________________________________________________  WHEN TO CALL YOUR DOCTOR: 1. Fever over 101.0 2. Nausea and/or vomiting. 3. Extreme swelling or bruising. 4. Continued bleeding from incision. 5. Increased pain, redness, or drainage from the incision.  The clinic staff is available to answer your questions during regular business hours.  Please dont hesitate to call and ask to speak to one of the nurses  for clinical concerns.  If you have a medical emergency, go to the nearest emergency room or call 911.  A surgeon from [facility]  is always on call at the hospital.    To Whom It May Concern:  Please excuse Anita Moreno from work from October 25 through October 30. She may return to work and full activity without limits on May 19, 2016. If you have any questions, please contact [facility].  Sincerely, Anita Moreno M.D.   For further questions, please visit [email or web address]      Post Anesthesia Home Care Instructions  Activity: Get plenty of rest for the remainder of the day. A responsible adult should stay with you for 24 hours following the procedure.  For the next 24 hours, DO NOT: -Drive a car -Paediatric nurse -Drink alcoholic beverages -Take any medication unless instructed by your physician -Make any legal decisions or sign important papers.  Meals: Start with liquid foods such as gelatin or soup. Progress to regular foods as tolerated. Avoid greasy, spicy, heavy foods. If nausea and/or vomiting occur, drink only clear liquids until the nausea and/or vomiting subsides. Call your physician if vomiting continues.  Special Instructions/Symptoms: Your throat may feel dry or sore from the anesthesia or the breathing tube placed in your throat during surgery. If this causes discomfort, gargle with warm salt water. The discomfort should disappear within 24 hours.  If you had a scopolamine patch placed behind your ear for the management of post- operative nausea and/or vomiting:  1. The medication in the patch is effective for 72 hours, after which it should be removed.  Wrap patch in a tissue and discard in the trash. Wash hands thoroughly with soap and water. 2. You may remove the patch earlier than 72 hours if you experience unpleasant side effects which may include dry mouth, dizziness or visual disturbances. 3. Avoid touching the  patch. Wash your hands with soap and water after contact with the patch.

## 2016-05-13 NOTE — Transfer of Care (Signed)
Immediate Anesthesia Transfer of Care Note  Patient: Anita Moreno  Procedure(s) Performed: Procedure(s) with comments: LEFT BREAST LUMPECTOMY WITH RADIOACTIVE SEED LOCALIZATION (Left) - LEFT BREAST LUMPECTOMY WITH RADIOACTIVE SEED LOCALIZATION  Patient Location: PACU  Anesthesia Type:General  Level of Consciousness: awake, sedated and patient cooperative  Airway & Oxygen Therapy: Patient Spontanous Breathing and Patient connected to face mask oxygen  Post-op Assessment: Report given to RN and Post -op Vital signs reviewed and stable  Post vital signs: Reviewed and stable  Last Vitals:  Vitals:   05/13/16 0850  BP: 134/70  Pulse: (!) 59  Resp: 18  Temp: 36.5 C    Last Pain:  Vitals:   05/13/16 0850  TempSrc: Oral         Complications: No apparent anesthesia complications

## 2016-05-13 NOTE — Anesthesia Preprocedure Evaluation (Signed)
Anesthesia Evaluation  Patient identified by MRN, date of birth, ID band Patient awake    Reviewed: Allergy & Precautions, NPO status , Patient's Chart, lab work & pertinent test results  Airway Mallampati: II  TM Distance: >3 FB Neck ROM: Full    Dental no notable dental hx.    Pulmonary neg pulmonary ROS,    Pulmonary exam normal breath sounds clear to auscultation       Cardiovascular hypertension, Pt. on medications Normal cardiovascular exam Rhythm:Regular Rate:Normal     Neuro/Psych negative neurological ROS  negative psych ROS   GI/Hepatic negative GI ROS, Neg liver ROS,   Endo/Other  negative endocrine ROS  Renal/GU negative Renal ROS  negative genitourinary   Musculoskeletal negative musculoskeletal ROS (+)   Abdominal   Peds negative pediatric ROS (+)  Hematology negative hematology ROS (+)   Anesthesia Other Findings   Reproductive/Obstetrics negative OB ROS                             Anesthesia Physical Anesthesia Plan  ASA: II  Anesthesia Plan: General   Post-op Pain Management:    Induction: Intravenous  Airway Management Planned: LMA  Additional Equipment:   Intra-op Plan:   Post-operative Plan: Extubation in OR  Informed Consent: I have reviewed the patients History and Physical, chart, labs and discussed the procedure including the risks, benefits and alternatives for the proposed anesthesia with the patient or authorized representative who has indicated his/her understanding and acceptance.   Dental advisory given  Plan Discussed with: CRNA  Anesthesia Plan Comments:         Anesthesia Quick Evaluation

## 2016-05-14 ENCOUNTER — Encounter (HOSPITAL_BASED_OUTPATIENT_CLINIC_OR_DEPARTMENT_OTHER): Payer: Self-pay | Admitting: Surgery

## 2016-05-30 ENCOUNTER — Other Ambulatory Visit: Payer: Self-pay | Admitting: Family

## 2016-05-30 DIAGNOSIS — F411 Generalized anxiety disorder: Secondary | ICD-10-CM

## 2016-06-03 ENCOUNTER — Ambulatory Visit: Payer: BLUE CROSS/BLUE SHIELD

## 2016-07-30 ENCOUNTER — Other Ambulatory Visit: Payer: Self-pay | Admitting: Family

## 2016-07-30 DIAGNOSIS — F411 Generalized anxiety disorder: Secondary | ICD-10-CM

## 2016-09-11 ENCOUNTER — Encounter: Payer: BLUE CROSS/BLUE SHIELD | Admitting: Family

## 2016-09-14 ENCOUNTER — Encounter: Payer: Self-pay | Admitting: Family

## 2016-09-17 ENCOUNTER — Ambulatory Visit (INDEPENDENT_AMBULATORY_CARE_PROVIDER_SITE_OTHER): Payer: BLUE CROSS/BLUE SHIELD | Admitting: Family

## 2016-09-17 ENCOUNTER — Encounter: Payer: Self-pay | Admitting: Family

## 2016-09-17 VITALS — BP 149/95 | HR 64 | Temp 96.6°F | Ht 62.0 in | Wt 180.0 lb

## 2016-09-17 DIAGNOSIS — I10 Essential (primary) hypertension: Secondary | ICD-10-CM

## 2016-09-17 DIAGNOSIS — Z0000 Encounter for general adult medical examination without abnormal findings: Secondary | ICD-10-CM

## 2016-09-17 DIAGNOSIS — F411 Generalized anxiety disorder: Secondary | ICD-10-CM

## 2016-09-17 DIAGNOSIS — E669 Obesity, unspecified: Secondary | ICD-10-CM

## 2016-09-17 DIAGNOSIS — E785 Hyperlipidemia, unspecified: Secondary | ICD-10-CM

## 2016-09-17 MED ORDER — LISINOPRIL 20 MG PO TABS: 20.0000 mg | ORAL_TABLET | Freq: Every day | ORAL | 3 refills | Status: DC

## 2016-09-17 NOTE — Patient Instructions (Signed)
Health Maintenance, Female Adopting a healthy lifestyle and getting preventive care can go a long way to promote health and wellness. Talk with your health care provider about what schedule of regular examinations is right for you. This is a good chance for you to check in with your provider about disease prevention and staying healthy. In between checkups, there are plenty of things you can do on your own. Experts have done a lot of research about which lifestyle changes and preventive measures are most likely to keep you healthy. Ask your health care provider for more information. Weight and diet Eat a healthy diet  Be sure to include plenty of vegetables, fruits, low-fat dairy products, and lean protein.  Do not eat a lot of foods high in solid fats, added sugars, or salt.  Get regular exercise. This is one of the most important things you can do for your health.  Most adults should exercise for at least 150 minutes each week. The exercise should increase your heart rate and make you sweat (moderate-intensity exercise).  Most adults should also do strengthening exercises at least twice a week. This is in addition to the moderate-intensity exercise. Maintain a healthy weight  Body mass index (BMI) is a measurement that can be used to identify possible weight problems. It estimates body fat based on height and weight. Your health care provider can help determine your BMI and help you achieve or maintain a healthy weight.  For females 49 years of age and older:  A BMI below 18.5 is considered underweight.  A BMI of 18.5 to 24.9 is normal.  A BMI of 25 to 29.9 is considered overweight.  A BMI of 30 and above is considered obese. Watch levels of cholesterol and blood lipids  You should start having your blood tested for lipids and cholesterol at 49 years of age, then have this test every 5 years.  You may need to have your cholesterol levels checked more often if:  Your lipid or  cholesterol levels are high.  You are older than 49 years of age.  You are at high risk for heart disease. Cancer screening Lung Cancer  Lung cancer screening is recommended for adults 49-42 years old who are at high risk for lung cancer because of a history of smoking.   A yearly low-dose CT scan of the lungs is recommended for people who:  Currently smoke.  Have quit within the past 15 years.  Have at least a 30-pack-year history of smoking. A pack year is smoking an average of one pack of cigarettes a day for 1 year.  Yearly screening should continue until it has been 15 years since you quit.  Yearly screening should stop if you develop a health problem that would prevent you from having lung cancer treatment. Breast Cancer  Practice breast self-awareness. This means understanding how your breasts normally appear and feel.  It also means doing regular breast self-exams. Let your health care provider know about any changes, no matter how small.  If you are in your 20s or 30s, you should have a clinical breast exam (CBE) by a health care provider every 1-3 years as part of a regular health exam.  If you are 34 or older, have a CBE every year. Also consider having a breast X-ray (mammogram) every year.  If you have a family history of breast cancer, talk to your health care provider about genetic screening.  If you are at high risk for breast cancer, talk  to your health care provider about having an MRI and a mammogram every year.  Breast cancer gene (BRCA) assessment is recommended for women who have family members with BRCA-related cancers. BRCA-related cancers include:  Breast.  Ovarian.  Tubal.  Peritoneal cancers.  Results of the assessment will determine the need for genetic counseling and BRCA1 and BRCA2 testing. Cervical Cancer  Your health care provider may recommend that you be screened regularly for cancer of the pelvic organs (ovaries, uterus, and vagina).  This screening involves a pelvic examination, including checking for microscopic changes to the surface of your cervix (Pap test). You may be encouraged to have this screening done every 3 years, beginning at age 24.  For women ages 66-65, health care providers may recommend pelvic exams and Pap testing every 3 years, or they may recommend the Pap and pelvic exam, combined with testing for human papilloma virus (HPV), every 5 years. Some types of HPV increase your risk of cervical cancer. Testing for HPV may also be done on women of any age with unclear Pap test results.  Other health care providers may not recommend any screening for nonpregnant women who are considered low risk for pelvic cancer and who do not have symptoms. Ask your health care provider if a screening pelvic exam is right for you.  If you have had past treatment for cervical cancer or a condition that could lead to cancer, you need Pap tests and screening for cancer for at least 20 years after your treatment. If Pap tests have been discontinued, your risk factors (such as having a new sexual partner) need to be reassessed to determine if screening should resume. Some women have medical problems that increase the chance of getting cervical cancer. In these cases, your health care provider may recommend more frequent screening and Pap tests. Colorectal Cancer  This type of cancer can be detected and often prevented.  Routine colorectal cancer screening usually begins at 49 years of age and continues through 49 years of age.  Your health care provider may recommend screening at an earlier age if you have risk factors for colon cancer.  Your health care provider may also recommend using home test kits to check for hidden blood in the stool.  A small camera at the end of a tube can be used to examine your colon directly (sigmoidoscopy or colonoscopy). This is done to check for the earliest forms of colorectal cancer.  Routine  screening usually begins at age 41.  Direct examination of the colon should be repeated every 5-10 years through 49 years of age. However, you may need to be screened more often if early forms of precancerous polyps or small growths are found. Skin Cancer  Check your skin from head to toe regularly.  Tell your health care provider about any new moles or changes in moles, especially if there is a change in a mole's shape or color.  Also tell your health care provider if you have a mole that is larger than the size of a pencil eraser.  Always use sunscreen. Apply sunscreen liberally and repeatedly throughout the day.  Protect yourself by wearing long sleeves, pants, a wide-brimmed hat, and sunglasses whenever you are outside. Heart disease, diabetes, and high blood pressure  High blood pressure causes heart disease and increases the risk of stroke. High blood pressure is more likely to develop in:  People who have blood pressure in the high end of the normal range (130-139/85-89 mm Hg).  People who are overweight or obese.  People who are African American.  If you are 59-24 years of age, have your blood pressure checked every 3-5 years. If you are 34 years of age or older, have your blood pressure checked every year. You should have your blood pressure measured twice-once when you are at a hospital or clinic, and once when you are not at a hospital or clinic. Record the average of the two measurements. To check your blood pressure when you are not at a hospital or clinic, you can use:  An automated blood pressure machine at a pharmacy.  A home blood pressure monitor.  If you are between 29 years and 60 years old, ask your health care provider if you should take aspirin to prevent strokes.  Have regular diabetes screenings. This involves taking a blood sample to check your fasting blood sugar level.  If you are at a normal weight and have a low risk for diabetes, have this test once  every three years after 49 years of age.  If you are overweight and have a high risk for diabetes, consider being tested at a younger age or more often. Preventing infection Hepatitis B  If you have a higher risk for hepatitis B, you should be screened for this virus. You are considered at high risk for hepatitis B if:  You were born in a country where hepatitis B is common. Ask your health care provider which countries are considered high risk.  Your parents were born in a high-risk country, and you have not been immunized against hepatitis B (hepatitis B vaccine).  You have HIV or AIDS.  You use needles to inject street drugs.  You live with someone who has hepatitis B.  You have had sex with someone who has hepatitis B.  You get hemodialysis treatment.  You take certain medicines for conditions, including cancer, organ transplantation, and autoimmune conditions. Hepatitis C  Blood testing is recommended for:  Everyone born from [birth year] through 1963.  Anyone with known risk factors for hepatitis C. Sexually transmitted infections (STIs)  You should be screened for sexually transmitted infections (STIs) including gonorrhea and chlamydia if:  You are sexually active and are younger than 49 years of age.  You are older than 49 years of age and your health care provider tells you that you are at risk for this type of infection.  Your sexual activity has changed since you were last screened and you are at an increased risk for chlamydia or gonorrhea. Ask your health care provider if you are at risk.  If you do not have HIV, but are at risk, it may be recommended that you take a prescription medicine daily to prevent HIV infection. This is called pre-exposure prophylaxis (PrEP). You are considered at risk if:  You are sexually active and do not regularly use condoms or know the HIV status of your partner(s).  You take drugs by injection.  You are sexually active with a partner  who has HIV. Talk with your health care provider about whether you are at high risk of being infected with HIV. If you choose to begin PrEP, you should first be tested for HIV. You should then be tested every 3 months for as long as you are taking PrEP. Pregnancy  If you are premenopausal and you may become pregnant, ask your health care provider about preconception counseling.  If you may become pregnant, take 400 to 800 micrograms (mcg) of folic acid  every day.  If you want to prevent pregnancy, talk to your health care provider about birth control (contraception). Osteoporosis and menopause  Osteoporosis is a disease in which the bones lose minerals and strength with aging. This can result in serious bone fractures. Your risk for osteoporosis can be identified using a bone density scan.  If you are 4 years of age or older, or if you are at risk for osteoporosis and fractures, ask your health care provider if you should be screened.  Ask your health care provider whether you should take a calcium or vitamin D supplement to lower your risk for osteoporosis.  Menopause may have certain physical symptoms and risks.  Hormone replacement therapy may reduce some of these symptoms and risks. Talk to your health care provider about whether hormone replacement therapy is right for you. Follow these instructions at home:  Schedule regular health, dental, and eye exams.  Stay current with your immunizations.  Do not use any tobacco products including cigarettes, chewing tobacco, or electronic cigarettes.  If you are pregnant, do not drink alcohol.  If you are breastfeeding, limit how much and how often you drink alcohol.  Limit alcohol intake to no more than 1 drink per day for nonpregnant women. One drink equals 12 ounces of beer, 5 ounces of wine, or 1 ounces of hard liquor.  Do not use street drugs.  Do not share needles.  Ask your health care provider for help if you need support  or information about quitting drugs.  Tell your health care provider if you often feel depressed.  Tell your health care provider if you have ever been abused or do not feel safe at home. This information is not intended to replace advice given to you by your health care provider. Make sure you discuss any questions you have with your health care provider. Document Released: 01/19/2011 Document Revised: 12/12/2015 Document Reviewed: 04/09/2015 Elsevier Interactive Patient Education  2017 [employer].

## 2016-09-17 NOTE — Progress Notes (Signed)
 Subjective:    Patient ID: Anita Moreno, female    DOB: 08/27/1965, 50 y.o.   MRN: [id number]  PT presents to the office today for CPE without pap. Hypertension  This is a new problem. The current episode started more than 1 month ago. The problem is unchanged. The problem is uncontrolled. Associated symptoms include anxiety and palpitations. Pertinent negatives include no blurred vision, peripheral edema, shortness of breath or sweats. Risk factors for coronary artery disease include dyslipidemia, family history, obesity and stress. Past treatments include diuretics. The current treatment provides mild improvement. There is no history of kidney disease, CAD/MI, CVA, heart failure or retinopathy. There is no history of sleep apnea or a thyroid problem.  Hyperlipidemia  This is a chronic problem. The current episode started more than 1 year ago. The problem is uncontrolled. Recent lipid tests were reviewed and are high. Exacerbating diseases include obesity. Pertinent negatives include no shortness of breath. Current antihyperlipidemic treatment includes statins. The current treatment provides mild improvement of lipids. Risk factors for coronary artery disease include dyslipidemia, family history, hypertension and obesity.  Anxiety  Presents for follow-up visit. The problem has been resolved. Symptoms include palpitations. Patient reports no depressed mood, excessive worry, irritability, nervous/anxious behavior or shortness of breath. Symptoms occur rarely.   Her past medical history is significant for anxiety/panic attacks. Past treatments include SSRIs. The treatment provided significant relief. Compliance with prior treatments has been good.      Review of Systems  Constitutional: Negative.  Negative for irritability.  Eyes: Negative for blurred vision.  Respiratory: Negative.  Negative for shortness of breath.   Cardiovascular: Positive for palpitations.  Gastrointestinal: Negative.     Endocrine: Negative.   Genitourinary: Negative.   Musculoskeletal: Negative.   Hematological: Negative.   Psychiatric/Behavioral: Negative.  The patient is not nervous/anxious.   All other systems reviewed and are negative.      Objective:   Physical Exam  Constitutional: She is oriented to person, place, and time. She appears well-developed and well-nourished. No distress.  HENT:  Head: Normocephalic and atraumatic.  Right Ear: External ear normal.  Left Ear: External ear normal.  Nose: Nose normal.  Mouth/Throat: Oropharynx is clear and moist.  Eyes: Pupils are equal, round, and reactive to light.  Neck: Normal range of motion. Neck supple. No thyromegaly present.  Cardiovascular: Normal rate, regular rhythm, normal heart sounds and intact distal pulses.   No murmur heard. Pulmonary/Chest: Effort normal and breath sounds normal. No respiratory distress. She has no wheezes.  Abdominal: Soft. Bowel sounds are normal. She exhibits no distension. There is no tenderness.  Musculoskeletal: Normal range of motion. She exhibits no edema or tenderness.  Neurological: She is alert and oriented to person, place, and time. She has normal reflexes. No cranial nerve deficit.  Skin: Skin is warm and dry.  Psychiatric: She has a normal mood and affect. Her behavior is normal. Judgment and thought content normal.  Vitals reviewed.   Temp (!) 96.6 F (35.9 C) (Oral)   Ht 5' 2" (1.575 m)   Wt 180 lb (81.6 kg)   BMI 32.92 kg/m        Assessment & Plan:  1. Essential hypertension -PT started on lisinopril 20 mg today -Dash diet information given -Exercise encouraged - Stress Management  -Continue current meds -RTO in 2 weeks - CMP14+EGFR - lisinopril (PRINIVIL,ZESTRIL) 20 MG tablet; Take 1 tablet (20 mg total) by mouth daily.  Dispense: 90 tablet; Refill: 3  2.   GAD (generalized anxiety disorder) - CMP14+EGFR  3. Hyperlipidemia, unspecified hyperlipidemia type -PT stopped  Lipitor, if LDL elevated wants to placed on Zocor instead - CMP14+EGFR  4. Obesity (BMI 30-39.9) - CMP14+EGFR  5. Annual physical exam  - CMP14+EGFR - Lipid panel - Thyroid Panel With TSH - VITAMIN D 25 Hydroxy (Vit-D Deficiency, Fractures) - CBC with Differential/Platelet   Continue all meds Labs pending Health Maintenance reviewed Diet and exercise encouraged RTO 2 weeks to recheck HTN  Christy Hawks, FNP   

## 2016-09-18 LAB — CBC WITH DIFFERENTIAL/PLATELET
BASOS ABS: 0 10*3/uL (ref 0.0–0.2)
Basos: 1 %
EOS (ABSOLUTE): 0.1 10*3/uL (ref 0.0–0.4)
EOS: 2 %
HEMOGLOBIN: 12.2 g/dL (ref 11.1–15.9)
Hematocrit: 36.6 % (ref 34.0–46.6)
IMMATURE GRANS (ABS): 0 10*3/uL (ref 0.0–0.1)
IMMATURE GRANULOCYTES: 0 %
LYMPHS: 39 %
Lymphocytes Absolute: 2.4 10*3/uL (ref 0.7–3.1)
MCH: 27.7 pg (ref 26.6–33.0)
MCHC: 33.3 g/dL (ref 31.5–35.7)
MCV: 83 fL (ref 79–97)
MONOCYTES: 8 %
Monocytes Absolute: 0.5 10*3/uL (ref 0.1–0.9)
Neutrophils Absolute: 3.1 10*3/uL (ref 1.4–7.0)
Neutrophils: 50 %
Platelets: 249 10*3/uL (ref 150–379)
RBC: 4.4 x10E6/uL (ref 3.77–5.28)
RDW: 15.1 % (ref 12.3–15.4)
WBC: 6.1 10*3/uL (ref 3.4–10.8)

## 2016-09-18 LAB — CMP14+EGFR
ALBUMIN: 4.2 g/dL (ref 3.5–5.5)
ALK PHOS: 78 IU/L (ref 39–117)
ALT: 48 IU/L — AB (ref 0–32)
AST: 43 IU/L — ABNORMAL HIGH (ref 0–40)
Albumin/Globulin Ratio: 1.6 (ref 1.2–2.2)
BUN/Creatinine Ratio: 15 (ref 9–23)
BUN: 9 mg/dL (ref 6–24)
CHLORIDE: 99 mmol/L (ref 96–106)
CO2: 27 mmol/L (ref 18–29)
CREATININE: 0.6 mg/dL (ref 0.57–1.00)
Calcium: 9.6 mg/dL (ref 8.7–10.2)
GFR calc non Af Amer: 108 mL/min/{1.73_m2} (ref >59)
GFR, EST AFRICAN AMERICAN: 125 mL/min/{1.73_m2} (ref >59)
GLUCOSE: 89 mg/dL (ref 65–99)
Globulin, Total: 2.7 g/dL (ref 1.5–4.5)
Potassium: 4.2 mmol/L (ref 3.5–5.2)
Sodium: 142 mmol/L (ref 134–144)
TOTAL PROTEIN: 6.9 g/dL (ref 6.0–8.5)

## 2016-09-18 LAB — THYROID PANEL WITH TSH
Free Thyroxine Index: 3.5 (ref 1.2–4.9)
T3 Uptake Ratio: 29 % (ref 24–39)
T4, Total: 12.2 ug/dL — ABNORMAL HIGH (ref 4.5–12.0)
TSH: 1.41 u[IU]/mL (ref 0.450–4.500)

## 2016-09-18 LAB — LIPID PANEL
CHOLESTEROL TOTAL: 186 mg/dL (ref 100–199)
Chol/HDL Ratio: 5.5 ratio units — ABNORMAL HIGH (ref 0.0–4.4)
HDL: 34 mg/dL — AB (ref >39)
LDL CALC: 130 mg/dL — AB (ref 0–99)
Triglycerides: 111 mg/dL (ref 0–149)
VLDL CHOLESTEROL CAL: 22 mg/dL (ref 5–40)

## 2016-09-18 LAB — VITAMIN D 25 HYDROXY (VIT D DEFICIENCY, FRACTURES): VIT D 25 HYDROXY: 33.7 ng/mL (ref 30.0–100.0)

## 2016-09-21 ENCOUNTER — Encounter: Payer: BLUE CROSS/BLUE SHIELD | Admitting: Nurse Practitioner

## 2016-09-24 ENCOUNTER — Telehealth: Payer: Self-pay | Admitting: Family

## 2016-09-24 MED ORDER — SIMVASTATIN 20 MG PO TABS: 20.0000 mg | ORAL_TABLET | Freq: Every day | ORAL | 3 refills | Status: DC

## 2016-09-24 NOTE — Telephone Encounter (Signed)
Pt aware of test results. I didn't see that the zocor was sent to the pharmacy and wasn't sure which dose you wanted sent over. Please advise.

## 2016-09-24 NOTE — Telephone Encounter (Signed)
Zocor Prescription sent to pharmacy

## 2016-10-02 ENCOUNTER — Telehealth: Payer: Self-pay | Admitting: Family

## 2016-10-02 ENCOUNTER — Other Ambulatory Visit: Payer: Self-pay | Admitting: Nurse Practitioner

## 2016-10-02 ENCOUNTER — Other Ambulatory Visit: Payer: Self-pay | Admitting: Family

## 2016-10-02 DIAGNOSIS — I10 Essential (primary) hypertension: Secondary | ICD-10-CM

## 2016-10-02 DIAGNOSIS — F411 Generalized anxiety disorder: Secondary | ICD-10-CM

## 2016-10-02 MED ORDER — CITALOPRAM HYDROBROMIDE 20 MG PO TABS: 20.0000 mg | ORAL_TABLET | Freq: Every day | ORAL | 1 refills | Status: DC

## 2016-10-02 MED ORDER — SIMVASTATIN 20 MG PO TABS: 20.0000 mg | ORAL_TABLET | Freq: Every day | ORAL | 0 refills | Status: DC

## 2016-10-02 MED ORDER — LISINOPRIL 20 MG PO TABS: 20.0000 mg | ORAL_TABLET | Freq: Every day | ORAL | 0 refills | Status: DC

## 2016-10-02 MED ORDER — HYDROCHLOROTHIAZIDE 25 MG PO TABS: 25.0000 mg | ORAL_TABLET | Freq: Every day | ORAL | 0 refills | Status: DC

## 2016-10-02 NOTE — Telephone Encounter (Signed)
Prescription sent to pharmacy.

## 2016-10-15 ENCOUNTER — Ambulatory Visit: Payer: BLUE CROSS/BLUE SHIELD | Admitting: Family

## 2016-10-23 ENCOUNTER — Encounter: Payer: Self-pay | Admitting: Family

## 2016-10-23 ENCOUNTER — Ambulatory Visit (INDEPENDENT_AMBULATORY_CARE_PROVIDER_SITE_OTHER): Payer: BLUE CROSS/BLUE SHIELD | Admitting: Family

## 2016-10-23 VITALS — BP 127/80 | HR 82 | Temp 97.4°F

## 2016-10-23 DIAGNOSIS — I10 Essential (primary) hypertension: Secondary | ICD-10-CM | POA: Diagnosis not present

## 2016-10-23 NOTE — Progress Notes (Signed)
   Subjective:    Patient ID: Anita Moreno, female    DOB: [birth year], 51 y.o.   MRN: [id number]  Hypertension  This is a chronic problem. The current episode started more than 1 year ago. The problem has been resolved since onset. The problem is controlled. Pertinent negatives include no headaches, palpitations, peripheral edema or shortness of breath. Risk factors for coronary artery disease include sedentary lifestyle, family history, dyslipidemia and obesity. Past treatments include ACE inhibitors and diuretics. The current treatment provides significant improvement. There is no history of kidney disease, CAD/MI, CVA or heart failure.      Review of Systems  Respiratory: Negative for shortness of breath.   Cardiovascular: Negative for palpitations.  Neurological: Negative for headaches.  All other systems reviewed and are negative.      Objective:   Physical Exam  Constitutional: She is oriented to person, place, and time. She appears well-developed and well-nourished. No distress.  HENT:  Head: Normocephalic.  Eyes: Pupils are equal, round, and reactive to light.  Neck: Normal range of motion. Neck supple. No thyromegaly present.  Cardiovascular: Normal rate, regular rhythm, normal heart sounds and intact distal pulses.   No murmur heard. Pulmonary/Chest: Effort normal and breath sounds normal. No respiratory distress. She has no wheezes.  Abdominal: Soft. Bowel sounds are normal. She exhibits no distension. There is no tenderness.  Musculoskeletal: Normal range of motion. She exhibits no edema or tenderness.  Neurological: She is alert and oriented to person, place, and time.  Skin: Skin is warm and dry.  Psychiatric: She has a normal mood and affect. Her behavior is normal. Judgment and thought content normal.  Vitals reviewed.      BP 127/80   Pulse 82   Temp 97.4 F (36.3 C) (Oral)      Assessment & Plan:  1. Essential hypertension -Dash diet information  given -Exercise encouraged - Stress Management  -Continue current meds -RTO in 6 months  - BMP8+EGFR    Evelina Dun, FNP

## 2016-10-23 NOTE — Patient Instructions (Signed)

## 2016-10-24 LAB — BMP8+EGFR
BUN/Creatinine Ratio: 21 (ref 9–23)
BUN: 19 mg/dL (ref 6–24)
CALCIUM: 9.5 mg/dL (ref 8.7–10.2)
CHLORIDE: 97 mmol/L (ref 96–106)
CO2: 27 mmol/L (ref 18–29)
Creatinine, Ser: 0.89 mg/dL (ref 0.57–1.00)
GFR calc Af Amer: 89 mL/min/{1.73_m2} (ref >59)
GFR, EST NON AFRICAN AMERICAN: 77 mL/min/{1.73_m2} (ref >59)
GLUCOSE: 96 mg/dL (ref 65–99)
Potassium: 4.1 mmol/L (ref 3.5–5.2)
SODIUM: 140 mmol/L (ref 134–144)

## 2016-12-22 ENCOUNTER — Ambulatory Visit (INDEPENDENT_AMBULATORY_CARE_PROVIDER_SITE_OTHER): Payer: BLUE CROSS/BLUE SHIELD | Admitting: Family

## 2016-12-22 ENCOUNTER — Encounter: Payer: Self-pay | Admitting: Family

## 2016-12-22 VITALS — BP 125/78 | HR 86 | Temp 99.2°F | Ht 62.0 in

## 2016-12-22 DIAGNOSIS — J209 Acute bronchitis, unspecified: Secondary | ICD-10-CM | POA: Diagnosis not present

## 2016-12-22 MED ORDER — AZITHROMYCIN 250 MG PO TABS: ORAL_TABLET | ORAL | 0 refills | Status: DC

## 2016-12-22 MED ORDER — FLUTICASONE PROPIONATE 50 MCG/ACT NA SUSP: 2.0000 | Freq: Every day | NASAL | 6 refills | Status: DC

## 2016-12-22 NOTE — Patient Instructions (Signed)

## 2016-12-22 NOTE — Progress Notes (Signed)
   Subjective:    Patient ID: Anita Moreno, female    DOB: 17-Apr-1966, 51 y.o.   MRN: [id number]  Cough  This is a new problem. The current episode started in the past 7 days (day 5). The problem has been gradually worsening. The problem occurs every few minutes. The cough is non-productive. Associated symptoms include chills, a fever, headaches, myalgias, nasal congestion, postnasal drip, shortness of breath, sweats and wheezing. Pertinent negatives include no ear congestion, ear pain or sore throat. The symptoms are aggravated by lying down. She has tried rest and OTC cough suppressant for the symptoms. The treatment provided mild relief.      Review of Systems  Constitutional: Positive for chills and fever.  HENT: Positive for postnasal drip. Negative for ear pain and sore throat.   Respiratory: Positive for cough, shortness of breath and wheezing.   Musculoskeletal: Positive for myalgias.  Neurological: Positive for headaches.  All other systems reviewed and are negative.      Objective:   Physical Exam  Constitutional: She is oriented to person, place, and time. She appears well-developed and well-nourished. No distress.  HENT:  Head: Normocephalic and atraumatic.  Right Ear: External ear normal.  Left Ear: External ear normal.  Nose: Mucosal edema and rhinorrhea present.  Mouth/Throat: Posterior oropharyngeal erythema present.  Eyes: Pupils are equal, round, and reactive to light.  Neck: Normal range of motion. Neck supple. No thyromegaly present.  Cardiovascular: Normal rate, regular rhythm, normal heart sounds and intact distal pulses.   No murmur heard. Pulmonary/Chest: Effort normal and breath sounds normal. No respiratory distress. She has no wheezes.  Constant nonproductive cough   Abdominal: Soft. Bowel sounds are normal. She exhibits no distension. There is no tenderness.  Musculoskeletal: Normal range of motion. She exhibits no edema or tenderness.  Neurological:  She is alert and oriented to person, place, and time.  Skin: Skin is warm and dry.  Psychiatric: She has a normal mood and affect. Her behavior is normal. Judgment and thought content normal.  Vitals reviewed.     BP 125/78   Pulse 86   Temp 99.2 F (37.3 C) (Oral)   Ht 5\' 2"  (1.575 m)      Assessment & Plan:  1. Acute bronchitis, unspecified organism - Take meds as prescribed - Use a cool mist humidifier  -Use saline nose sprays frequently -Saline irrigations of the nose can be very helpful if done frequently.  * 4X daily for 1 week*  * Use of a nettie pot can be helpful with this. Follow directions with this* -Force fluids -For any cough or congestion  Use plain Mucinex- regular strength or max strength is fine   * Children- consult with Pharmacist for dosing -For fever or aces or pains- take tylenol or ibuprofen appropriate for age and weight.  * for fevers greater than 101 orally you may alternate ibuprofen and tylenol every  3 hours. -Throat lozenges if help -New toothbrush in 3 days - azithromycin (ZITHROMAX) 250 MG tablet; Take 500 mg once, then 250 mg for four days  Dispense: 6 tablet; Refill: 0   Evelina Dun, FNP

## 2017-04-20 ENCOUNTER — Other Ambulatory Visit: Payer: Self-pay | Admitting: Family

## 2017-04-20 DIAGNOSIS — F411 Generalized anxiety disorder: Secondary | ICD-10-CM

## 2017-04-20 DIAGNOSIS — I10 Essential (primary) hypertension: Secondary | ICD-10-CM

## 2017-05-05 IMAGING — MG DIGITAL DIAGNOSTIC UNILATERAL LEFT MAMMOGRAM
2 series · 2 of 2 positions shown · non-contrast
Comparison: Previous exam(s).

CLINICAL DATA: Follow-up ultrasound-guided core needle biopsy of
left breast upper inner quadrant 11:30 o'clock mass.

EXAM:
DIAGNOSTIC LEFT MAMMOGRAM POST ULTRASOUND BIOPSY

[L CC]
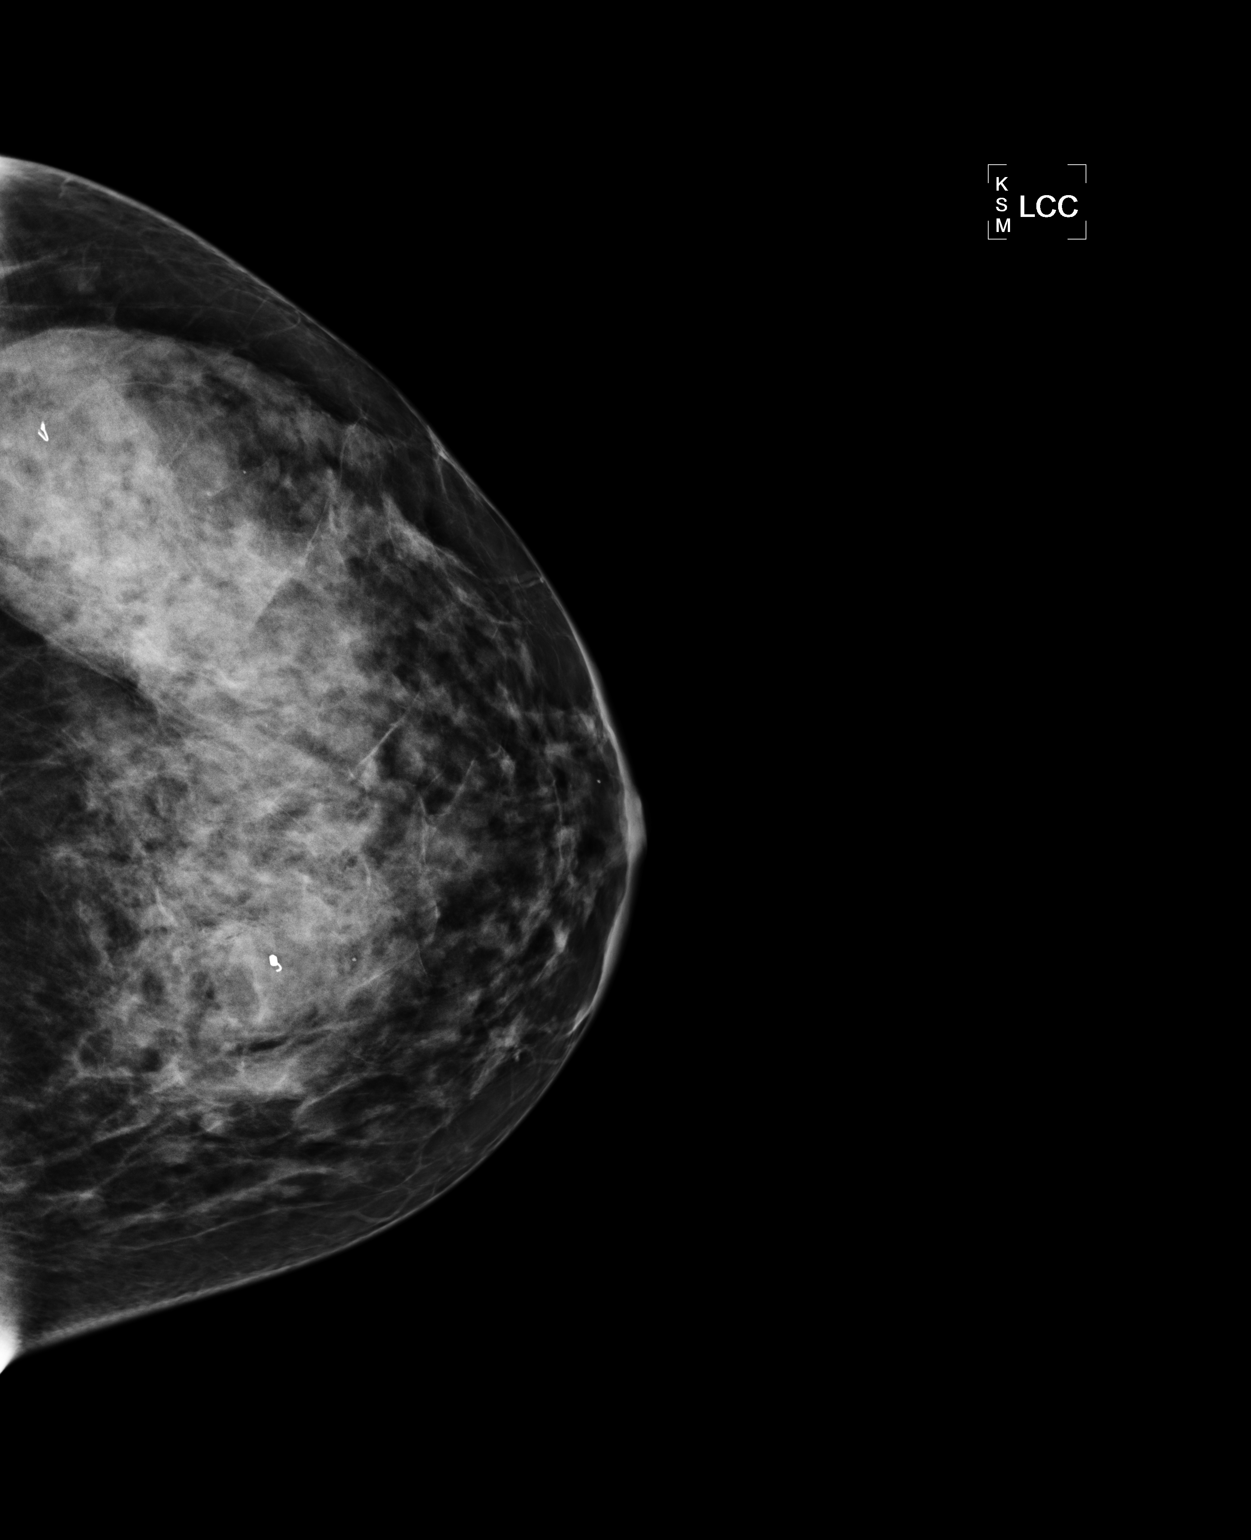

[L ML]
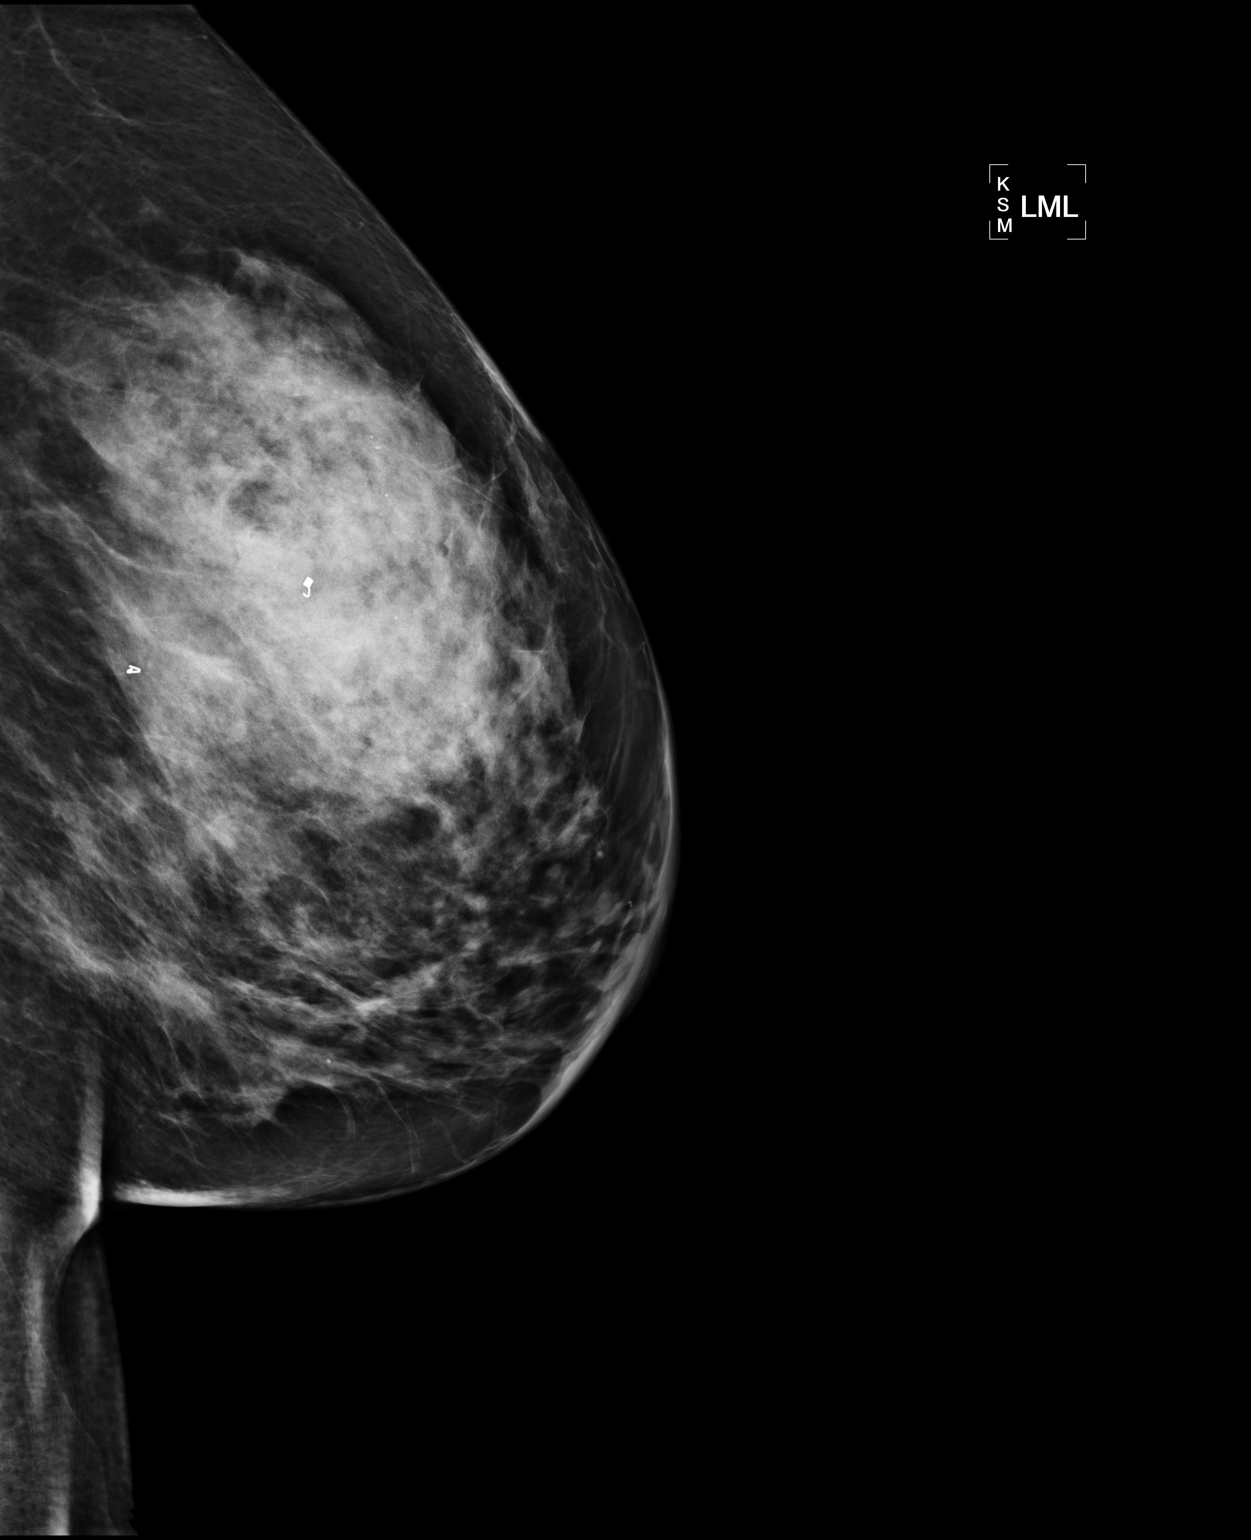

[2 of 2 positions shown; findings below may reference images not displayed]

FINDINGS: Mammographic images were obtained following ultrasound guided biopsy
of left breast 11:30 o'clock mass. Two-view mammography demonstrates
presence of coil shaped marker within the left breast upper inner
quadrant, middle depth, and could mammographic position.
IMPRESSION: Successful placement of coil shaped marker within the biopsy site,
post ultrasound-guided core needle biopsy of the left breast.

Final Assessment: Post Procedure Mammograms for Marker Placement

## 2017-05-26 IMAGING — MG BREAST SURGICAL SPECIMEN
1 series · 1 of 1 positions shown · non-contrast
Comparison: Previous exam(s).

CLINICAL DATA: 49-year-old female -evaluate surgical specimen
following left breast mass excision.

EXAM:
SPECIMEN RADIOGRAPH OF THE LEFT BREAST

[L]
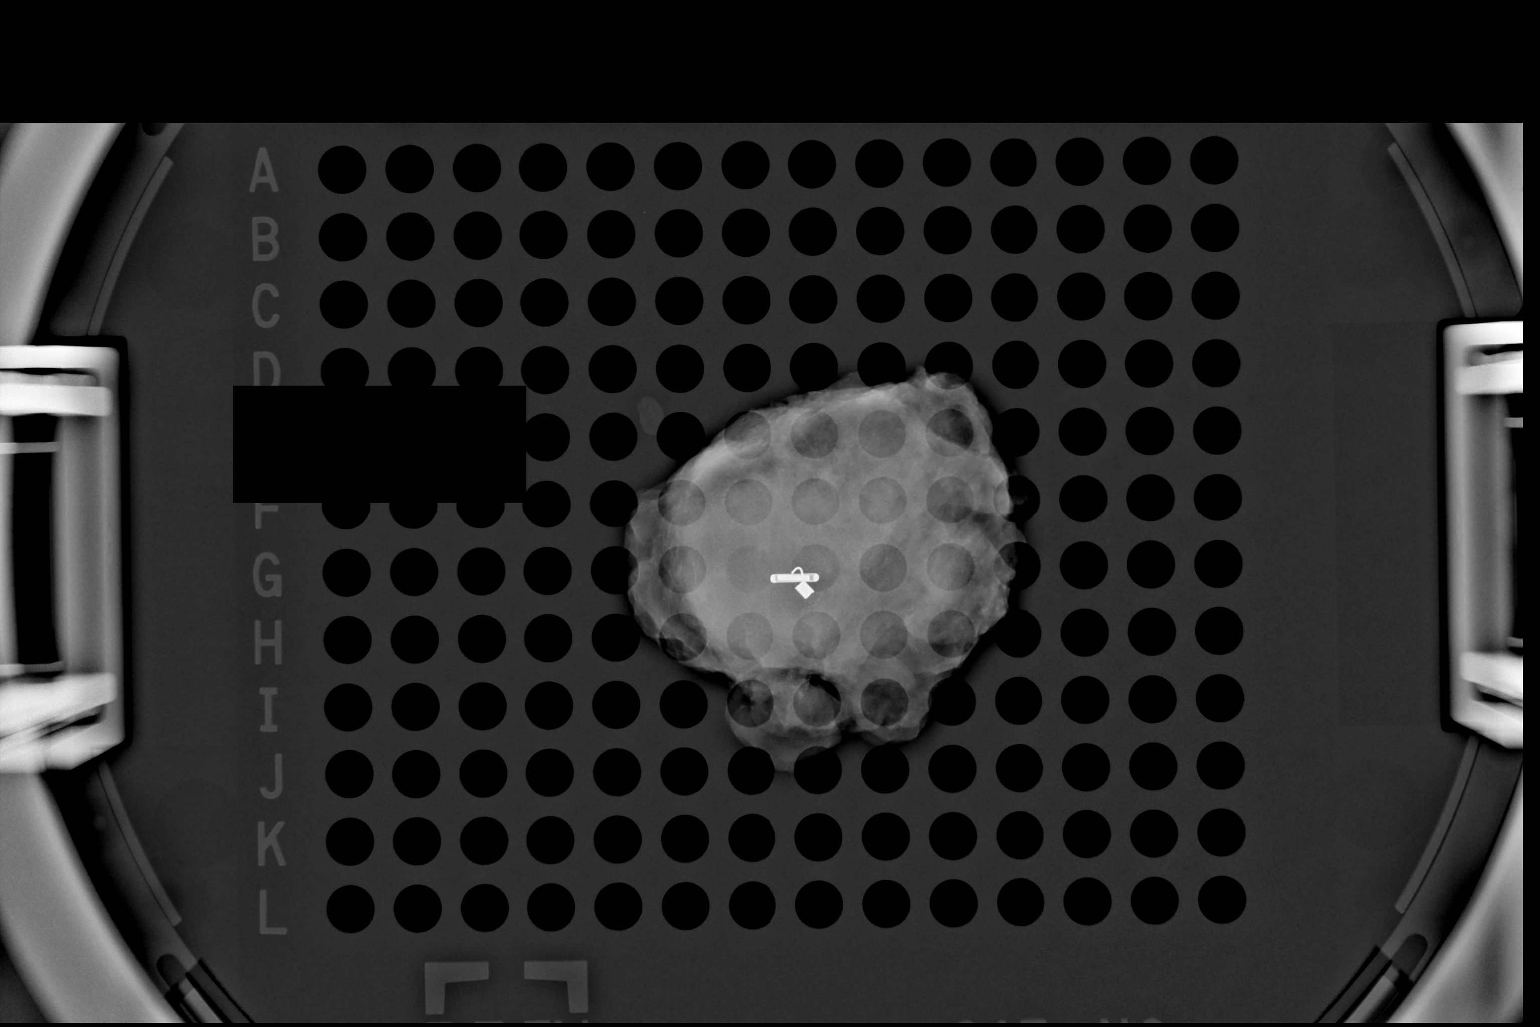

[1 of 1 positions shown; findings below may reference images not displayed]

FINDINGS: Status post excision of the left breast. The radioactive seed and
biopsy marker clip are present, completely intact, and were marked
for pathology.
IMPRESSION: Specimen radiograph of the left breast.

## 2017-07-15 ENCOUNTER — Encounter: Payer: Self-pay | Admitting: Family

## 2017-07-15 ENCOUNTER — Ambulatory Visit: Payer: BLUE CROSS/BLUE SHIELD | Admitting: Family

## 2017-07-15 VITALS — BP 135/80 | HR 69 | Temp 99.7°F | Ht 62.0 in | Wt 188.8 lb

## 2017-07-15 DIAGNOSIS — J209 Acute bronchitis, unspecified: Secondary | ICD-10-CM | POA: Diagnosis not present

## 2017-07-15 MED ORDER — BENZONATATE 200 MG PO CAPS: 200.0000 mg | ORAL_CAPSULE | Freq: Three times a day (TID) | ORAL | 1 refills | Status: DC | PRN

## 2017-07-15 MED ORDER — HYDROCODONE-HOMATROPINE 5-1.5 MG/5ML PO SYRP: 5.0000 mL | ORAL_SOLUTION | Freq: Three times a day (TID) | ORAL | 0 refills | Status: DC | PRN

## 2017-07-15 MED ORDER — PREDNISONE 10 MG (21) PO TBPK: ORAL_TABLET | ORAL | 0 refills | Status: DC

## 2017-07-15 NOTE — Patient Instructions (Signed)

## 2017-07-15 NOTE — Progress Notes (Signed)
   Subjective:    Patient ID: Anita Moreno, female    DOB: Mar 28, 1966, 51 y.o.   MRN: [id number]  Cough  This is a new problem. The current episode started 1 to 4 weeks ago. The problem has been unchanged. The problem occurs every few minutes. The cough is productive of sputum. Associated symptoms include chills, a fever, a sore throat, shortness of breath and wheezing. Pertinent negatives include no ear congestion, ear pain, headaches, myalgias or postnasal drip. She has tried rest and OTC cough suppressant for the symptoms.      Review of Systems  Constitutional: Positive for chills and fever.  HENT: Positive for sore throat. Negative for ear pain and postnasal drip.   Respiratory: Positive for cough, shortness of breath and wheezing.   Musculoskeletal: Negative for myalgias.  Neurological: Negative for headaches.  All other systems reviewed and are negative.      Objective:   Physical Exam  Constitutional: She is oriented to person, place, and time. She appears well-developed and well-nourished. No distress.  HENT:  Head: Normocephalic and atraumatic.  Right Ear: External ear normal.  Left Ear: External ear normal.  Eyes: Pupils are equal, round, and reactive to light.  Neck: Normal range of motion. Neck supple. No thyromegaly present.  Cardiovascular: Normal rate, regular rhythm, normal heart sounds and intact distal pulses.  No murmur heard. Pulmonary/Chest: Effort normal and breath sounds normal. No respiratory distress. She has no wheezes.  Constant intermittent nonproductive cough  Abdominal: Soft. Bowel sounds are normal. She exhibits no distension. There is no tenderness.  Musculoskeletal: Normal range of motion. She exhibits no edema or tenderness.  Neurological: She is alert and oriented to person, place, and time.  Skin: Skin is warm and dry.  Psychiatric: She has a normal mood and affect. Her behavior is normal. Judgment and thought content normal.  Vitals  reviewed.     BP 135/80   Pulse 69   Temp 99.7 F (37.6 C) (Oral)   Ht 5\' 2"  (1.575 m)   Wt 188 lb 12.8 oz (85.6 kg)   BMI 34.53 kg/m      Assessment & Plan:  1. Acute bronchitis, unspecified organism - Take meds as prescribed - Use a cool mist humidifier  -Force fluids -For any cough or congestion  Use plain Mucinex- regular strength or max strength is fin -For fever or aces or pains- take tylenol or ibuprofen appropriate for age and weight. -Throat lozenges if help - predniSONE (STERAPRED UNI-PAK 21 TAB) 10 MG (21) TBPK tablet; Use as directed  Dispense: 21 tablet; Refill: 0 - benzonatate (TESSALON) 200 MG capsule; Take 1 capsule (200 mg total) by mouth 3 (three) times daily as needed.  Dispense: 30 capsule; Refill: 1 - HYDROcodone-homatropine (HYCODAN) 5-1.5 MG/5ML syrup; Take 5 mLs by mouth every 8 (eight) hours as needed for cough.  Dispense: 120 mL; Refill: 0    Evelina Dun, FNP

## 2017-07-16 ENCOUNTER — Ambulatory Visit: Payer: BLUE CROSS/BLUE SHIELD | Admitting: Family

## 2017-07-24 ENCOUNTER — Other Ambulatory Visit: Payer: Self-pay | Admitting: *Deleted

## 2017-07-24 DIAGNOSIS — F411 Generalized anxiety disorder: Secondary | ICD-10-CM

## 2017-07-24 DIAGNOSIS — I10 Essential (primary) hypertension: Secondary | ICD-10-CM

## 2017-07-24 MED ORDER — LISINOPRIL 20 MG PO TABS: 20.0000 mg | ORAL_TABLET | Freq: Every day | ORAL | 0 refills | Status: DC

## 2017-07-24 MED ORDER — CITALOPRAM HYDROBROMIDE 20 MG PO TABS: 20.0000 mg | ORAL_TABLET | Freq: Every day | ORAL | 0 refills | Status: DC

## 2017-08-19 ENCOUNTER — Telehealth: Payer: Self-pay | Admitting: Family

## 2017-08-19 DIAGNOSIS — F411 Generalized anxiety disorder: Secondary | ICD-10-CM

## 2017-08-19 DIAGNOSIS — I10 Essential (primary) hypertension: Secondary | ICD-10-CM

## 2017-08-19 MED ORDER — CITALOPRAM HYDROBROMIDE 20 MG PO TABS: 20.0000 mg | ORAL_TABLET | Freq: Every day | ORAL | 0 refills | Status: DC

## 2017-08-19 MED ORDER — LISINOPRIL 20 MG PO TABS: 20.0000 mg | ORAL_TABLET | Freq: Every day | ORAL | 0 refills | Status: DC

## 2017-08-19 NOTE — Telephone Encounter (Signed)
Sent in rf on the 2 that epic states she is taking regularly. She was LM to call if she needed others.

## 2017-08-20 ENCOUNTER — Other Ambulatory Visit: Payer: Self-pay | Admitting: Family

## 2017-08-20 ENCOUNTER — Ambulatory Visit: Payer: BLUE CROSS/BLUE SHIELD | Admitting: Family

## 2017-08-20 DIAGNOSIS — I10 Essential (primary) hypertension: Secondary | ICD-10-CM

## 2017-08-20 MED ORDER — HYDROCHLOROTHIAZIDE 25 MG PO TABS: 25.0000 mg | ORAL_TABLET | Freq: Every day | ORAL | 3 refills | Status: DC

## 2017-08-20 NOTE — Telephone Encounter (Signed)
All meds were call in but her hydrochlorothiazide (HYDRODIURIL) 25 MG tablet. Please send into [facility].

## 2017-08-20 NOTE — Telephone Encounter (Signed)
Done

## 2017-08-24 ENCOUNTER — Ambulatory Visit: Payer: BLUE CROSS/BLUE SHIELD | Admitting: Family

## 2017-08-27 ENCOUNTER — Ambulatory Visit: Payer: BLUE CROSS/BLUE SHIELD | Admitting: Nurse Practitioner

## 2017-08-31 ENCOUNTER — Ambulatory Visit: Payer: BLUE CROSS/BLUE SHIELD | Admitting: Nurse Practitioner

## 2017-08-31 ENCOUNTER — Encounter: Payer: Self-pay | Admitting: Nurse Practitioner

## 2017-08-31 VITALS — BP 137/84 | HR 72 | Temp 96.6°F | Ht 62.0 in | Wt 188.0 lb

## 2017-08-31 DIAGNOSIS — E785 Hyperlipidemia, unspecified: Secondary | ICD-10-CM

## 2017-08-31 DIAGNOSIS — I10 Essential (primary) hypertension: Secondary | ICD-10-CM | POA: Diagnosis not present

## 2017-08-31 DIAGNOSIS — N6320 Unspecified lump in the left breast, unspecified quadrant: Secondary | ICD-10-CM

## 2017-08-31 DIAGNOSIS — E669 Obesity, unspecified: Secondary | ICD-10-CM

## 2017-08-31 DIAGNOSIS — F411 Generalized anxiety disorder: Secondary | ICD-10-CM

## 2017-08-31 MED ORDER — SIMVASTATIN 20 MG PO TABS: 20.0000 mg | ORAL_TABLET | Freq: Every day | ORAL | 0 refills | Status: DC

## 2017-08-31 MED ORDER — CITALOPRAM HYDROBROMIDE 20 MG PO TABS: 20.0000 mg | ORAL_TABLET | Freq: Every day | ORAL | 0 refills | Status: DC

## 2017-08-31 MED ORDER — LISINOPRIL-HYDROCHLOROTHIAZIDE 20-25 MG PO TABS: 1.0000 | ORAL_TABLET | Freq: Every day | ORAL | 3 refills | Status: DC

## 2017-08-31 NOTE — Patient Instructions (Signed)
DASH Eating Plan DASH stands for "Dietary Approaches to Stop Hypertension." The DASH eating plan is a healthy eating plan that has been shown to reduce high blood pressure (hypertension). It may also reduce your risk for type 2 diabetes, heart disease, and stroke. The DASH eating plan may also help with weight loss. What are tips for following this plan? General guidelines  Avoid eating more than 2,300 mg (milligrams) of salt (sodium) a day. If you have hypertension, you may need to reduce your sodium intake to 1,500 mg a day.  Limit alcohol intake to no more than 1 drink a day for nonpregnant women and 2 drinks a day for men. One drink equals 12 oz of beer, 5 oz of wine, or 1 oz of hard liquor.  Work with your health care provider to maintain a healthy body weight or to lose weight. Ask what an ideal weight is for you.  Get at least 30 minutes of exercise that causes your heart to beat faster (aerobic exercise) most days of the week. Activities may include walking, swimming, or biking.  Work with your health care provider or diet and nutrition specialist (dietitian) to adjust your eating plan to your individual calorie needs. Reading food labels  Check food labels for the amount of sodium per serving. Choose foods with less than 5 percent of the Daily Value of sodium. Generally, foods with less than 300 mg of sodium per serving fit into this eating plan.  To find whole grains, look for the word "whole" as the first word in the ingredient list. Shopping  Buy products labeled as "low-sodium" or "no salt added."  Buy fresh foods. Avoid canned foods and premade or frozen meals. Cooking  Avoid adding salt when cooking. Use salt-free seasonings or herbs instead of table salt or sea salt. Check with your health care provider or pharmacist before using salt substitutes.  Do not fry foods. Cook foods using healthy methods such as baking, boiling, grilling, and broiling instead.  Cook with  heart-healthy oils, such as olive, canola, soybean, or sunflower oil. Meal planning   Eat a balanced diet that includes: ? 5 or more servings of fruits and vegetables each day. At each meal, try to fill half of your plate with fruits and vegetables. ? Up to 6-8 servings of whole grains each day. ? Less than 6 oz of lean meat, poultry, or fish each day. A 3-oz serving of meat is about the same size as a deck of cards. One egg equals 1 oz. ? 2 servings of low-fat dairy each day. ? A serving of nuts, seeds, or beans 5 times each week. ? Heart-healthy fats. Healthy fats called Omega-3 fatty acids are found in foods such as flaxseeds and coldwater fish, like sardines, salmon, and mackerel.  Limit how much you eat of the following: ? Canned or prepackaged foods. ? Food that is high in trans fat, such as fried foods. ? Food that is high in saturated fat, such as fatty meat. ? Sweets, desserts, sugary drinks, and other foods with added sugar. ? Full-fat dairy products.  Do not salt foods before eating.  Try to eat at least 2 vegetarian meals each week.  Eat more home-cooked food and less restaurant, buffet, and fast food.  When eating at a restaurant, ask that your food be prepared with less salt or no salt, if possible. What foods are recommended? The items listed may not be a complete list. Talk with your dietitian about what   dietary choices are best for you. Grains Whole-grain or whole-wheat bread. Whole-grain or whole-wheat pasta. Brown rice. Oatmeal. Quinoa. Bulgur. Whole-grain and low-sodium cereals. Pita bread. Low-fat, low-sodium crackers. Whole-wheat flour tortillas. Vegetables Fresh or frozen vegetables (raw, steamed, roasted, or grilled). Low-sodium or reduced-sodium tomato and vegetable juice. Low-sodium or reduced-sodium tomato sauce and tomato paste. Low-sodium or reduced-sodium canned vegetables. Fruits All fresh, dried, or frozen fruit. Canned fruit in natural juice (without  added sugar). Meat and other protein foods Skinless chicken or turkey. Ground chicken or turkey. Pork with fat trimmed off. Fish and seafood. Egg whites. Dried beans, peas, or lentils. Unsalted nuts, nut butters, and seeds. Unsalted canned beans. Lean cuts of beef with fat trimmed off. Low-sodium, lean deli meat. Dairy Low-fat (1%) or fat-free (skim) milk. Fat-free, low-fat, or reduced-fat cheeses. Nonfat, low-sodium ricotta or cottage cheese. Low-fat or nonfat yogurt. Low-fat, low-sodium cheese. Fats and oils Soft margarine without trans fats. Vegetable oil. Low-fat, reduced-fat, or light mayonnaise and salad dressings (reduced-sodium). Canola, safflower, olive, soybean, and sunflower oils. Avocado. Seasoning and other foods Herbs. Spices. Seasoning mixes without salt. Unsalted popcorn and pretzels. Fat-free sweets. What foods are not recommended? The items listed may not be a complete list. Talk with your dietitian about what dietary choices are best for you. Grains Baked goods made with fat, such as croissants, muffins, or some breads. Dry pasta or rice meal packs. Vegetables Creamed or fried vegetables. Vegetables in a cheese sauce. Regular canned vegetables (not low-sodium or reduced-sodium). Regular canned tomato sauce and paste (not low-sodium or reduced-sodium). Regular tomato and vegetable juice (not low-sodium or reduced-sodium). Pickles. Olives. Fruits Canned fruit in a light or heavy syrup. Fried fruit. Fruit in cream or butter sauce. Meat and other protein foods Fatty cuts of meat. Ribs. Fried meat. Bacon. Sausage. Bologna and other processed lunch meats. Salami. Fatback. Hotdogs. Bratwurst. Salted nuts and seeds. Canned beans with added salt. Canned or smoked fish. Whole eggs or egg yolks. Chicken or turkey with skin. Dairy Whole or 2% milk, cream, and half-and-half. Whole or full-fat cream cheese. Whole-fat or sweetened yogurt. Full-fat cheese. Nondairy creamers. Whipped toppings.  Processed cheese and cheese spreads. Fats and oils Butter. Stick margarine. Lard. Shortening. Ghee. Bacon fat. Tropical oils, such as coconut, palm kernel, or palm oil. Seasoning and other foods Salted popcorn and pretzels. Onion salt, garlic salt, seasoned salt, table salt, and sea salt. Worcestershire sauce. Tartar sauce. Barbecue sauce. Teriyaki sauce. Soy sauce, including reduced-sodium. Steak sauce. Canned and packaged gravies. Fish sauce. Oyster sauce. Cocktail sauce. Horseradish that you find on the shelf. Ketchup. Mustard. Meat flavorings and tenderizers. Bouillon cubes. Hot sauce and Tabasco sauce. Premade or packaged marinades. Premade or packaged taco seasonings. Relishes. Regular salad dressings. Where to find more information:  National Heart, Lung, and Blood Institute: www.nhlbi.nih.gov  American Heart Association: www.heart.org Summary  The DASH eating plan is a healthy eating plan that has been shown to reduce high blood pressure (hypertension). It may also reduce your risk for type 2 diabetes, heart disease, and stroke.  With the DASH eating plan, you should limit salt (sodium) intake to 2,300 mg a day. If you have hypertension, you may need to reduce your sodium intake to 1,500 mg a day.  When on the DASH eating plan, aim to eat more fresh fruits and vegetables, whole grains, lean proteins, low-fat dairy, and heart-healthy fats.  Work with your health care provider or diet and nutrition specialist (dietitian) to adjust your eating plan to your individual   calorie needs. This information is not intended to replace advice given to you by your health care provider. Make sure you discuss any questions you have with your health care provider. Document Released: 06/25/2011 Document Revised: 06/29/2016 Document Reviewed: 06/29/2016 Elsevier Interactive Patient Education  2018 Elsevier Inc.  

## 2017-08-31 NOTE — Progress Notes (Signed)
Subjective:    Patient ID: Anita Moreno, female    DOB: October 24, 1965, 52 y.o.   MRN: [id number]  HPI  Anita Moreno is here today for follow up of chronic medical problem.  Outpatient Encounter Medications as of 08/31/2017  Medication Sig  . citalopram (CELEXA) 20 MG tablet Take 1 tablet (20 mg total) by mouth daily.  . hydrochlorothiazide (HYDRODIURIL) 25 MG tablet Take 1 tablet (25 mg total) by mouth daily.  . simvastatin (ZOCOR) 20 MG tablet Take 1 tablet (20 mg total) by mouth at bedtime.  Marland Kitchen lisinopril (PRINIVIL,ZESTRIL) 20 MG tablet Take 1 tablet (20 mg total) by mouth daily.     1. Essential hypertension  No c/o chest pan, sob or headache. Does not check blood pressure at home. BP Readings from Last 3 Encounters:  08/31/17 137/84  07/15/17 135/80  12/22/16 125/78     2. GAD (generalized anxiety disorder)  Takes celexa daily which she says to help her a lot.  3. Hyperlipidemia, unspecified hyperlipidemia type  Not watching diet at all  4. Left breast mass  was removed about 2 years ago and was benign.  5. Obesity (BMI 30-39.9)  No recent weight changes    New complaints: None today  Social history: Lives with her partner and works at [employer].    Review of Systems  Constitutional: Negative for activity change and appetite change.  HENT: Negative.   Eyes: Negative for pain.  Respiratory: Negative for shortness of breath.   Cardiovascular: Negative for chest pain, palpitations and leg swelling.  Gastrointestinal: Negative for abdominal pain.  Endocrine: Negative for polydipsia.  Genitourinary: Negative.   Skin: Negative for rash.  Neurological: Negative for dizziness, weakness and headaches.  Hematological: Does not bruise/bleed easily.  Psychiatric/Behavioral: Negative.   All other systems reviewed and are negative.      Objective:   Physical Exam  Constitutional: She is oriented to person, place, and time. She appears well-developed and  well-nourished.  HENT:  Nose: Nose normal.  Mouth/Throat: Oropharynx is clear and moist.  Eyes: EOM are normal.  Neck: Trachea normal, normal range of motion and full passive range of motion without pain. Neck supple. No JVD present. Carotid bruit is not present. No thyromegaly present.  Cardiovascular: Normal rate, regular rhythm, normal heart sounds and intact distal pulses. Exam reveals no gallop and no friction rub.  No murmur heard. Pulmonary/Chest: Effort normal and breath sounds normal.  Abdominal: Soft. Bowel sounds are normal. She exhibits no distension and no mass. There is no tenderness.  Musculoskeletal: Normal range of motion.  Lymphadenopathy:    She has no cervical adenopathy.  Neurological: She is alert and oriented to person, place, and time. She has normal reflexes.  Skin: Skin is warm and dry.  Psychiatric: She has a normal mood and affect. Her behavior is normal. Judgment and thought content normal.   BP 137/84   Pulse 72   Temp (!) 96.6 F (35.9 C) (Oral)   Ht _0  (1.575 m)   Wt 188 lb (85.3 kg)   BMI 34.39 kg/m         Assessment & Plan:  1. Essential hypertension Low sodium diet - lisinopril-hydrochlorothiazide (PRINZIDE,ZESTORETIC) 20-25 MG tablet; Take 1 tablet by mouth daily.  Dispense: 90 tablet; Refill: 3 - CMP14+EGFR  2. GAD (generalized anxiety disorder) Stress management - citalopram (CELEXA) 20 MG tablet; Take 1 tablet (20 mg total) by mouth daily.  Dispense: 30 tablet; Refill: 0  3. Hyperlipidemia, unspecified hyperlipidemia type Low sodium diet - simvastatin (ZOCOR) 20 MG tablet; Take 1 tablet (20 mg total) by mouth at bedtime.  Dispense: 90 tablet; Refill: 0 - Lipid panel  4. Left breast mass  5. Obesity (BMI 30-39.9) Discussed diet and exercise for person with BMI >25 Will recheck weight in 3-6 months    Labs pending Health maintenance reviewed Diet and exercise encouraged Continue all meds Follow up  In 3 months    [place], FNP

## 2017-09-01 LAB — CMP14+EGFR
ALK PHOS: 69 IU/L (ref 39–117)
ALT: 19 IU/L (ref 0–32)
AST: 14 IU/L (ref 0–40)
Albumin/Globulin Ratio: 1.7 (ref 1.2–2.2)
Albumin: 4.4 g/dL (ref 3.5–5.5)
BUN/Creatinine Ratio: 19 (ref 9–23)
BUN: 15 mg/dL (ref 6–24)
CHLORIDE: 99 mmol/L (ref 96–106)
CO2: 25 mmol/L (ref 20–29)
CREATININE: 0.77 mg/dL (ref 0.57–1.00)
Calcium: 9.5 mg/dL (ref 8.7–10.2)
GFR calc Af Amer: 105 mL/min/{1.73_m2} (ref >59)
GFR calc non Af Amer: 91 mL/min/{1.73_m2} (ref >59)
GLUCOSE: 92 mg/dL (ref 65–99)
Globulin, Total: 2.6 g/dL (ref 1.5–4.5)
Potassium: 4 mmol/L (ref 3.5–5.2)
Sodium: 140 mmol/L (ref 134–144)
TOTAL PROTEIN: 7 g/dL (ref 6.0–8.5)

## 2017-09-01 LAB — LIPID PANEL
CHOL/HDL RATIO: 5.6 ratio — AB (ref 0.0–4.4)
CHOLESTEROL TOTAL: 201 mg/dL — AB (ref 100–199)
HDL: 36 mg/dL — AB (ref >39)
LDL Calculated: 136 mg/dL — ABNORMAL HIGH (ref 0–99)
Triglycerides: 146 mg/dL (ref 0–149)
VLDL CHOLESTEROL CAL: 29 mg/dL (ref 5–40)

## 2017-09-30 ENCOUNTER — Telehealth: Payer: Self-pay | Admitting: Family

## 2017-09-30 NOTE — Telephone Encounter (Signed)
Patient aware of lab results.

## 2017-12-20 ENCOUNTER — Other Ambulatory Visit: Payer: Self-pay | Admitting: Nurse Practitioner

## 2017-12-20 DIAGNOSIS — Z1231 Encounter for screening mammogram for malignant neoplasm of breast: Secondary | ICD-10-CM

## 2018-01-10 ENCOUNTER — Ambulatory Visit
Admission: RE | Admit: 2018-01-10 | Discharge: 2018-01-10 | Disposition: A | Discharge status: Home / Self Care | Payer: BLUE CROSS/BLUE SHIELD | Source: Ambulatory Visit | Attending: Nurse Practitioner | Admitting: Nurse Practitioner

## 2018-01-10 DIAGNOSIS — Z1231 Encounter for screening mammogram for malignant neoplasm of breast: Secondary | ICD-10-CM

## 2018-05-10 ENCOUNTER — Ambulatory Visit: Payer: BLUE CROSS/BLUE SHIELD | Admitting: Nurse Practitioner

## 2018-05-10 ENCOUNTER — Encounter: Payer: Self-pay | Admitting: Nurse Practitioner

## 2018-05-10 VITALS — BP 128/78 | HR 60 | Temp 96.8°F | Ht 62.0 in | Wt 180.0 lb

## 2018-05-10 DIAGNOSIS — E785 Hyperlipidemia, unspecified: Secondary | ICD-10-CM

## 2018-05-10 DIAGNOSIS — E669 Obesity, unspecified: Secondary | ICD-10-CM

## 2018-05-10 DIAGNOSIS — I10 Essential (primary) hypertension: Secondary | ICD-10-CM

## 2018-05-10 DIAGNOSIS — F411 Generalized anxiety disorder: Secondary | ICD-10-CM

## 2018-05-10 DIAGNOSIS — M25562 Pain in left knee: Secondary | ICD-10-CM | POA: Diagnosis not present

## 2018-05-10 MED ORDER — LISINOPRIL-HYDROCHLOROTHIAZIDE 20-25 MG PO TABS: 1.0000 | ORAL_TABLET | Freq: Every day | ORAL | 1 refills | Status: DC

## 2018-05-10 MED ORDER — CITALOPRAM HYDROBROMIDE 20 MG PO TABS: 20.0000 mg | ORAL_TABLET | Freq: Every day | ORAL | 1 refills | Status: DC

## 2018-05-10 MED ORDER — SIMVASTATIN 20 MG PO TABS: 20.0000 mg | ORAL_TABLET | Freq: Every day | ORAL | 1 refills | Status: AC

## 2018-05-10 MED ORDER — METHYLPREDNISOLONE ACETATE 40 MG/ML IJ SUSP
40.0000 mg | Freq: Once | INTRAMUSCULAR | Status: AC
  Administered 2018-05-10: 40 mg via INTRAMUSCULAR

## 2018-05-10 MED ORDER — BUPIVACAINE HCL 0.25 % IJ SOLN
1.0000 mL | Freq: Once | INTRAMUSCULAR | Status: AC
  Administered 2018-05-10: 1 mL via INTRA_ARTICULAR

## 2018-05-10 NOTE — Addendum Note (Signed)
Addended by: Chevis Pretty on: 05/10/2018 11:52 AM   Modules accepted: Orders

## 2018-05-10 NOTE — Progress Notes (Addendum)
Subjective:    Patient ID: Anita Moreno, female    DOB: 16-Oct-1965, 52 y.o.   MRN: [id number]   Chief Complaint: medical management of chronic issues  HPI:  1. Essential hypertension  No c/o chest pain sob or headache. Does not check blood pressure at home. BP Readings from Last 3 Encounters:  08/31/17 137/84  07/15/17 135/80  12/22/16 125/78     2. GAD (generalized anxiety disorder) she is on celexa dialy which works well to keep her calm. No side effects.  3. Hyperlipidemia, unspecified hyperlipidemia type  Does not really watch diet or exercise  4. Obesity (BMI 30-39.9)  Weight is down 8lbs since last visit.    Outpatient Encounter Medications as of 05/10/2018  Medication Sig  . citalopram (CELEXA) 20 MG tablet Take 1 tablet (20 mg total) by mouth daily.  Marland Kitchen lisinopril-hydrochlorothiazide (PRINZIDE,ZESTORETIC) 20-25 MG tablet Take 1 tablet by mouth daily.  . simvastatin (ZOCOR) 20 MG tablet Take 1 tablet (20 mg total) by mouth at bedtime.      New complaints: Patient c/o left knee pain. She has had synvasc injections in the past. Would like steroid injection today in it.  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   Review of Systems  Constitutional: Negative for activity change and appetite change.  HENT: Negative.   Eyes: Negative for pain.  Respiratory: Negative for shortness of breath.   Cardiovascular: Negative for chest pain, palpitations and leg swelling.  Gastrointestinal: Negative for abdominal pain.  Endocrine: Negative for polydipsia.  Genitourinary: Negative.   Skin: Negative for rash.  Neurological: Negative for dizziness, weakness and headaches.  Hematological: Does not bruise/bleed easily.  Psychiatric/Behavioral: Negative.   All other systems reviewed and are negative.      Objective:   Physical Exam  Constitutional: She is oriented to person, place, and time.  She appears well-developed and well-nourished. No distress.  HENT:  Head: Normocephalic.  Nose: Nose normal.  Mouth/Throat: Oropharynx is clear and moist.  Eyes: Pupils are equal, round, and reactive to light. EOM are normal.  Neck: Normal range of motion. Neck supple. No JVD present. Carotid bruit is not present.  Cardiovascular: Normal rate, regular rhythm, normal heart sounds and intact distal pulses.  Pulmonary/Chest: Effort normal and breath sounds normal. No respiratory distress. She has no wheezes. She has no rales. She exhibits no tenderness.  Abdominal: Soft. Normal appearance, normal aorta and bowel sounds are normal. She exhibits no distension, no abdominal bruit, no pulsatile midline mass and no mass. There is no splenomegaly or hepatomegaly. There is no tenderness.  Musculoskeletal: Normal range of motion. She exhibits no edema.  Mild left knee effusion Crepitus on flexion and extension   Lymphadenopathy:    She has no cervical adenopathy.  Neurological: She is alert and oriented to person, place, and time. She has normal reflexes.  Skin: Skin is warm and dry.  Psychiatric: She has a normal mood and affect. Her behavior is normal. Judgment and thought content normal.  Nursing note and vitals reviewed.  BP 128/78   Pulse 60   Temp (!) 96.8 F (36 C) (Oral)   Ht 5' 2" (1.575 m)   Wt 180 lb (81.6 kg)   BMI 32.92 kg/m   Left knee injection under sterile technique      Assessment & Plan:  Anita Moreno comes in today with chief complaint of Medical Management of Chronic Issues  Diagnosis and orders addressed:  1. Essential hypertension Low sodium diet - lisinopril-hydrochlorothiazide (PRINZIDE,ZESTORETIC) 20-25 MG tablet; Take 1 tablet by mouth daily.  Dispense: 90 tablet; Refill: 1 - CMP14+EGFR  2. GAD (generalized anxiety disorder) Stress management - citalopram (CELEXA) 20 MG tablet; Take 1 tablet (20 mg total) by mouth daily.  Dispense: 90 tablet; Refill:  1  3. Hyperlipidemia, unspecified hyperlipidemia type Low fat diet - simvastatin (ZOCOR) 20 MG tablet; Take 1 tablet (20 mg total) by mouth at bedtime.  Dispense: 90 tablet; Refill: 1 - Lipid panel  4. Obesity (BMI 30-39.9) Discussed diet and exercise for person with BMI >25 Will recheck weight in 3-6 months  5. Left knee pain Knee injection Wrap when walkng Elevate when sitting 'ice as needed  Labs pending Health Maintenance reviewed Diet and exercise encouraged  Follow up plan: 6 months   [place], FNP

## 2018-05-10 NOTE — Patient Instructions (Signed)
Stress and Stress Management Stress is a normal reaction to life events. It is what you feel when life demands more than you are used to or more than you can handle. Some stress can be useful. For example, the stress reaction can help you catch the last bus of the day, study for a test, or meet a deadline at work. But stress that occurs too often or for too long can cause problems. It can affect your emotional health and interfere with relationships and normal daily activities. Too much stress can weaken your immune system and increase your risk for physical illness. If you already have a medical problem, stress can make it worse. What are the causes? All sorts of life events may cause stress. An event that causes stress for one person may not be stressful for another person. Major life events commonly cause stress. These may be positive or negative. Examples include losing your job, moving into a new home, getting married, having a baby, or losing a loved one. Less obvious life events may also cause stress, especially if they occur day after day or in combination. Examples include working long hours, driving in traffic, caring for children, being in debt, or being in a difficult relationship. What are the signs or symptoms? Stress may cause emotional symptoms including, the following:  Anxiety. This is feeling worried, afraid, on edge, overwhelmed, or out of control.  Anger. This is feeling irritated or impatient.  Depression. This is feeling sad, down, helpless, or guilty.  Difficulty focusing, remembering, or making decisions.  Stress may cause physical symptoms, including the following:  Aches and pains. These may affect your head, neck, back, stomach, or other areas of your body.  Tight muscles or clenched jaw.  Low energy or trouble sleeping.  Stress may cause unhealthy behaviors, including the following:  Eating to feel better (overeating) or skipping meals.  Sleeping too little,  too much, or both.  Working too much or putting off tasks (procrastination).  Smoking, drinking alcohol, or using drugs to feel better.  How is this diagnosed? Stress is diagnosed through an assessment by your health care provider. Your health care provider will ask questions about your symptoms and any stressful life events.Your health care provider will also ask about your medical history and may order blood tests or other tests. Certain medical conditions and medicine can cause physical symptoms similar to stress. Mental illness can cause emotional symptoms and unhealthy behaviors similar to stress. Your health care provider may refer you to a mental health professional for further evaluation. How is this treated? Stress management is the recommended treatment for stress.The goals of stress management are reducing stressful life events and coping with stress in healthy ways. Techniques for reducing stressful life events include the following:  Stress identification. Self-monitor for stress and identify what causes stress for you. These skills may help you to avoid some stressful events.  Time management. Set your priorities, keep a calendar of events, and learn to say "no." These tools can help you avoid making too many commitments.  Techniques for coping with stress include the following:  Rethinking the problem. Try to think realistically about stressful events rather than ignoring them or overreacting. Try to find the positives in a stressful situation rather than focusing on the negatives.  Exercise. Physical exercise can release both physical and emotional tension. The key is to find a form of exercise you enjoy and do it regularly.  Relaxation techniques. These relax the body and  mind. Examples include yoga, meditation, tai chi, biofeedback, deep breathing, progressive muscle relaxation, listening to music, being out in nature, journaling, and other hobbies. Again, the key is to find  one or more that you enjoy and can do regularly.  Healthy lifestyle. Eat a balanced diet, get plenty of sleep, and do not smoke. Avoid using alcohol or drugs to relax.  Strong support network. Spend time with family, friends, or other people you enjoy being around.Express your feelings and talk things over with someone you trust.  Counseling or talktherapy with a mental health professional may be helpful if you are having difficulty managing stress on your own. Medicine is typically not recommended for the treatment of stress.Talk to your health care provider if you think you need medicine for symptoms of stress. Follow these instructions at home:  Keep all follow-up visits as directed by your health care provider.  Take all medicines as directed by your health care provider. Contact a health care provider if:  Your symptoms get worse or you start having new symptoms.  You feel overwhelmed by your problems and can no longer manage them on your own. Get help right away if:  You feel like hurting yourself or someone else. This information is not intended to replace advice given to you by your health care provider. Make sure you discuss any questions you have with your health care provider. Document Released: 12/30/2000 Document Revised: 12/12/2015 Document Reviewed: 02/28/2013 [employer] Interactive Patient Education  2017 [employer]  

## 2018-05-11 LAB — CMP14+EGFR
A/G RATIO: 1.8 (ref 1.2–2.2)
ALBUMIN: 4.8 g/dL (ref 3.5–5.5)
ALT: 16 IU/L (ref 0–32)
AST: 16 IU/L (ref 0–40)
Alkaline Phosphatase: 72 IU/L (ref 39–117)
BUN / CREAT RATIO: 16 (ref 9–23)
BUN: 12 mg/dL (ref 6–24)
Bilirubin Total: 0.2 mg/dL (ref 0.0–1.2)
CALCIUM: 9.4 mg/dL (ref 8.7–10.2)
CO2: 22 mmol/L (ref 20–29)
Chloride: 100 mmol/L (ref 96–106)
Creatinine, Ser: 0.75 mg/dL (ref 0.57–1.00)
GFR calc Af Amer: 108 mL/min/{1.73_m2} (ref >59)
GFR, EST NON AFRICAN AMERICAN: 94 mL/min/{1.73_m2} (ref >59)
Globulin, Total: 2.7 g/dL (ref 1.5–4.5)
Glucose: 85 mg/dL (ref 65–99)
POTASSIUM: 3.8 mmol/L (ref 3.5–5.2)
Sodium: 140 mmol/L (ref 134–144)
Total Protein: 7.5 g/dL (ref 6.0–8.5)

## 2018-05-11 LAB — LIPID PANEL
CHOL/HDL RATIO: 5 ratio — AB (ref 0.0–4.4)
Cholesterol, Total: 201 mg/dL — ABNORMAL HIGH (ref 100–199)
HDL: 40 mg/dL (ref >39)
LDL Calculated: 136 mg/dL — ABNORMAL HIGH (ref 0–99)
Triglycerides: 127 mg/dL (ref 0–149)
VLDL CHOLESTEROL CAL: 25 mg/dL (ref 5–40)

## 2018-08-04 ENCOUNTER — Encounter: Payer: Self-pay | Admitting: *Deleted

## 2018-11-09 ENCOUNTER — Ambulatory Visit: Payer: BLUE CROSS/BLUE SHIELD | Admitting: Nurse Practitioner

## 2018-11-15 ENCOUNTER — Ambulatory Visit: Payer: BLUE CROSS/BLUE SHIELD | Admitting: Nurse Practitioner

## 2019-02-28 ENCOUNTER — Other Ambulatory Visit: Payer: Self-pay | Admitting: Nurse Practitioner

## 2019-02-28 DIAGNOSIS — I10 Essential (primary) hypertension: Secondary | ICD-10-CM

## 2019-02-28 DIAGNOSIS — F411 Generalized anxiety disorder: Secondary | ICD-10-CM

## 2019-05-03 ENCOUNTER — Other Ambulatory Visit: Payer: Self-pay | Admitting: Nurse Practitioner

## 2019-05-03 DIAGNOSIS — F411 Generalized anxiety disorder: Secondary | ICD-10-CM

## 2019-05-03 DIAGNOSIS — I10 Essential (primary) hypertension: Secondary | ICD-10-CM

## 2019-05-03 MED ORDER — CITALOPRAM HYDROBROMIDE 20 MG PO TABS: 20.0000 mg | ORAL_TABLET | Freq: Every day | ORAL | 0 refills | Status: AC

## 2019-05-03 MED ORDER — LISINOPRIL-HYDROCHLOROTHIAZIDE 20-25 MG PO TABS: 1.0000 | ORAL_TABLET | Freq: Every day | ORAL | 0 refills | Status: AC

## 2019-05-03 NOTE — Telephone Encounter (Signed)
MMM NTBS 30 days given 03/01/19

## 2019-05-03 NOTE — Telephone Encounter (Signed)
Appointment scheduled for 05/25/2019. 30 day supply given until she can be seen.

## 2019-05-03 NOTE — Addendum Note (Signed)
Addended by: Thana Ates on: 05/03/2019 11:16 AM   Modules accepted: Orders

## 2019-05-25 ENCOUNTER — Ambulatory Visit: Payer: Self-pay | Admitting: Nurse Practitioner
